# Patient Record
Sex: Male | Born: 1945 | Race: White | Hispanic: No | Smoking: Former smoker
Health system: Southern US, Community
[De-identification: ages and names within clinical notes are randomized; demographics above are authoritative.]

## PROBLEM LIST (undated history)

## (undated) DIAGNOSIS — I10 Essential (primary) hypertension: Secondary | ICD-10-CM

## (undated) DIAGNOSIS — M1990 Unspecified osteoarthritis, unspecified site: Secondary | ICD-10-CM

## (undated) HISTORY — PX: JOINT REPLACEMENT: SHX530

## (undated) HISTORY — PX: KNEE ARTHROSCOPY: SUR90

## (undated) HISTORY — PX: NASAL SINUS SURGERY: SHX719

## (undated) HISTORY — PX: BACK SURGERY: SHX140

---

## 2000-05-09 ENCOUNTER — Emergency Department (HOSPITAL_COMMUNITY): Admission: EM | Admit: 2000-05-09 | Discharge: 2000-05-09 | Payer: Self-pay

## 2000-05-29 ENCOUNTER — Emergency Department (HOSPITAL_COMMUNITY): Admission: EM | Admit: 2000-05-29 | Discharge: 2000-05-29 | Payer: Self-pay | Admitting: Emergency Medicine

## 2010-04-04 ENCOUNTER — Emergency Department (HOSPITAL_COMMUNITY): Admission: EM | Admit: 2010-04-04 | Discharge: 2010-04-04 | Payer: Self-pay | Admitting: Emergency Medicine

## 2010-05-16 ENCOUNTER — Emergency Department (HOSPITAL_COMMUNITY): Admission: EM | Admit: 2010-05-16 | Discharge: 2010-05-16 | Payer: Self-pay | Admitting: Emergency Medicine

## 2010-05-25 ENCOUNTER — Encounter (INDEPENDENT_AMBULATORY_CARE_PROVIDER_SITE_OTHER): Payer: Self-pay | Admitting: *Deleted

## 2010-11-04 NOTE — Letter (Signed)
Summary: Primary Care Appointment Letter  [facility]  [address] [place], [place] [address]   Phone: [phone]  Fax: [phone]    05/25/2010 MRN: [id number]  [facility] [address] [place], [place]  [address]  Dear Mr. Babiarz,   Your Primary Care Physician  has indicated that:    _______it is time to schedule an appointment.    _______you missed your appointment on______ and need to call and          reschedule.    _______you need to have lab work done.    _______you need to schedule an appointment discuss lab or test results.    ___X____you need to call to reschedule your appointment that is                       scheduled on Sept 23rd.     Please call our office as soon as possible. Our phone number is [phone] option #1. Please press option 1. Our office is open 8a-12noon and 1p-5p, Monday through Friday.     Thank you,    [facility]

## 2011-04-20 ENCOUNTER — Other Ambulatory Visit: Payer: Self-pay | Admitting: Orthopedic Surgery

## 2011-04-20 ENCOUNTER — Ambulatory Visit (HOSPITAL_COMMUNITY)
Admission: RE | Admit: 2011-04-20 | Discharge: 2011-04-20 | Disposition: A | Discharge status: Home / Self Care | Payer: Medicare Other | Source: Ambulatory Visit | Attending: Orthopedic Surgery | Admitting: Orthopedic Surgery

## 2011-04-20 ENCOUNTER — Encounter (HOSPITAL_COMMUNITY): Payer: Medicare Other

## 2011-04-20 ENCOUNTER — Other Ambulatory Visit (HOSPITAL_COMMUNITY): Payer: Self-pay | Admitting: Orthopedic Surgery

## 2011-04-20 DIAGNOSIS — Z01812 Encounter for preprocedural laboratory examination: Secondary | ICD-10-CM | POA: Insufficient documentation

## 2011-04-20 DIAGNOSIS — M7130 Other bursal cyst, unspecified site: Secondary | ICD-10-CM | POA: Insufficient documentation

## 2011-04-20 DIAGNOSIS — M47817 Spondylosis without myelopathy or radiculopathy, lumbosacral region: Secondary | ICD-10-CM | POA: Insufficient documentation

## 2011-04-20 DIAGNOSIS — Z01818 Encounter for other preprocedural examination: Secondary | ICD-10-CM

## 2011-04-20 DIAGNOSIS — Z01811 Encounter for preprocedural respiratory examination: Secondary | ICD-10-CM | POA: Insufficient documentation

## 2011-04-20 DIAGNOSIS — J984 Other disorders of lung: Secondary | ICD-10-CM | POA: Insufficient documentation

## 2011-04-20 LAB — URINALYSIS, ROUTINE W REFLEX MICROSCOPIC
Ketones, ur: NEGATIVE mg/dL
Nitrite: NEGATIVE
Protein, ur: NEGATIVE mg/dL

## 2011-04-20 LAB — COMPREHENSIVE METABOLIC PANEL
AST: 11 U/L (ref 0–37)
Alkaline Phosphatase: 70 U/L (ref 39–117)
BUN: 13 mg/dL (ref 6–23)
CO2: 26 mEq/L (ref 19–32)
Calcium: 9.7 mg/dL (ref 8.4–10.5)
Chloride: 103 mEq/L (ref 96–112)
Creatinine, Ser: 0.71 mg/dL (ref 0.50–1.35)
Glucose, Bld: 92 mg/dL (ref 70–99)
Sodium: 137 mEq/L (ref 135–145)
Total Bilirubin: 0.4 mg/dL (ref 0.3–1.2)
Total Protein: 7.3 g/dL (ref 6.0–8.3)

## 2011-04-20 LAB — DIFFERENTIAL
Basophils Absolute: 0 10*3/uL (ref 0.0–0.1)
Eosinophils Relative: 2 % (ref 0–5)

## 2011-04-20 LAB — CBC
HCT: 46 % (ref 39.0–52.0)
Hemoglobin: 15 g/dL (ref 13.0–17.0)
MCH: 30.5 pg (ref 26.0–34.0)
MCHC: 32.6 g/dL (ref 30.0–36.0)
MCV: 93.7 fL (ref 78.0–100.0)
Platelets: 248 10*3/uL (ref 150–400)
RBC: 4.91 MIL/uL (ref 4.22–5.81)

## 2011-04-20 LAB — SURGICAL PCR SCREEN: Staphylococcus aureus: NEGATIVE

## 2011-04-20 LAB — PROTIME-INR
INR: 1.06 (ref 0.00–1.49)
Prothrombin Time: 14 seconds (ref 11.6–15.2)

## 2011-04-27 ENCOUNTER — Inpatient Hospital Stay (HOSPITAL_COMMUNITY): Payer: Medicare Other

## 2011-04-27 ENCOUNTER — Inpatient Hospital Stay (HOSPITAL_COMMUNITY)
Admission: RE | Admit: 2011-04-27 | Discharge: 2011-04-30 | DRG: 491 | Disposition: A | Discharge status: Home Health | Payer: Medicare Other | Source: Ambulatory Visit | Attending: Orthopedic Surgery | Admitting: Orthopedic Surgery

## 2011-04-27 DIAGNOSIS — Z87891 Personal history of nicotine dependence: Secondary | ICD-10-CM

## 2011-04-27 DIAGNOSIS — Z791 Long term (current) use of non-steroidal anti-inflammatories (NSAID): Secondary | ICD-10-CM

## 2011-04-27 DIAGNOSIS — I10 Essential (primary) hypertension: Secondary | ICD-10-CM | POA: Diagnosis present

## 2011-04-27 DIAGNOSIS — M7130 Other bursal cyst, unspecified site: Secondary | ICD-10-CM | POA: Diagnosis present

## 2011-04-27 DIAGNOSIS — M48061 Spinal stenosis, lumbar region without neurogenic claudication: Principal | ICD-10-CM | POA: Diagnosis present

## 2011-04-27 DIAGNOSIS — R509 Fever, unspecified: Secondary | ICD-10-CM | POA: Diagnosis not present

## 2011-04-27 DIAGNOSIS — Z01812 Encounter for preprocedural laboratory examination: Secondary | ICD-10-CM

## 2011-04-27 DIAGNOSIS — M216X9 Other acquired deformities of unspecified foot: Secondary | ICD-10-CM | POA: Diagnosis present

## 2011-04-27 DIAGNOSIS — Z79899 Other long term (current) drug therapy: Secondary | ICD-10-CM

## 2011-04-29 NOTE — Discharge Summary (Signed)
  NAME:  Anthony Wiley, Anthony Wiley NO.:  [id number]  MEDICAL RECORD NO.:  [id number]  LOCATION:  1605                         FACILITY:  [facility]  PHYSICIAN:  Georges Lynch. Nichole Keltner, M.D.DATE OF BIRTH:  April 22, 1946  DATE OF ADMISSION:  04/27/2011 DATE OF DISCHARGE:                              DISCHARGE SUMMARY   He was admitted to the hospital on April 27, 2011, and was taken to surgery by me which I did a decompressive lumbar laminectomy at L4-5 and excision of a large synovial cyst and exploration of the L4 and L5 regions of the right.  Note, he had a large synovial cyst compressing his L5 nerve root.  He also had a partial footdrop preop.  Postop on day #1, April 28, 2011, his footdrop was really markedly improved.  He had basically no leg pain but he had rather significant pain in his back which I can understand.  Temperature was 99.8.  He was moving his legs well.  He had voided postop.  We will have him up ambulating today and hopefully by the morning he will be discharged, to be followed in the office.  PERTINENT LABORATORY STUDIES:  His nasal screening test was no MRSA, no staph.  His white count on admission 9.5, hemoglobin 15, hematocrit 46. His platelets are 248.  Normal differential.  The sodium was 137, potassium 4.1, chloride 103, glucose 92, BUN 13, creatinine 0.71.  His SGOT was 11, SGPT was 8.  His INR was 1.06.  The PTT was 29.  The urinalysis was basically negative.  The chest x-ray showed no active disease.  DISCHARGE DIAGNOSES: 1. Spinal stenosis L4-5. 2. 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  DISCHARGE CONDITION:  Improved.  DISCHARGE DIET:  Regular diet.  DISCHARGE MEDICATIONS:  All listed in the discharge manager.  DISCHARGE INSTRUCTIONS:  He will see me in the office in 2 weeks or prior if presenting problem and I will have home health check him for dressing and wound checks and therapy at home.           ______________________________ Georges Lynch. Darrelyn Hillock, M.D.     RAG/MEDQ  D:  04/28/2011  T:  04/28/2011  Job:  [id number]  cc:   Windy Fast A. Darrelyn Hillock, M.D. Fax: [phone]  Electronically Signed by Ranee Gosselin M.D. on 04/29/2011 03:26:10 PM

## 2011-04-29 NOTE — Op Note (Signed)
NAME:  OLE, LAFON NO.:  [id number]  MEDICAL RECORD NO.:  [id number]  LOCATION:                                 FACILITY:  PHYSICIAN:  Georges Lynch. Davit Vassar, M.D.DATE OF BIRTH:  1946-03-16  DATE OF PROCEDURE: DATE OF DISCHARGE:                              OPERATIVE REPORT   INDICATIONS:  Mr. Anthony Wiley is brought in to the hospital and taken to surgery today on April 27, 2011, with the chief complaint of pain in his right lower extremity and a partial footdrop on the right.  His MRI revealed a severe recess stenosis and a synovial cyst versus arachnoid cyst.  PREOPERATIVE DIAGNOSES: 1. Spinal stenosis at L4-5. 2. Probable large synovial cyst at L4-5 from the L4-5 facet on the     right.  POSTOPERATIVE DIAGNOSES: 1. Spinal stenosis at L4-5. 2. Probable large synovial cyst at L4-5 from the L4-5 facet on the     right.  SURGEON:  Georges Lynch. Davionna Blacksher, MD  ASSISTANT:  Jene Every, MD  OPERATIONS: 1. "Central complete decompressive lumbar laminectomy at L4-5 for     spinal stenosis. 2. Exploration of the L4 root and the L5 root on the right, 2 roots     were explored. 3. Excision of a large synovial cyst at L4-5 on the right."  PROCEDURE:  Appropriate time-out was carried out first while the patient was on the spinal frame and sterile prep and draping was carried out. He had 2 g of IV Ancef.  At this time, prior to taking him in to the surgery in the OR, I did mark the right side of his back although we were going central in the holding area.  Under general anesthesia, routine orthopedic prep and draping of the lower back was carried out.  With the patient on spinal frame, as I mentioned, he had 2 g of IV Ancef.  Appropriate time-out, as I mentioned, was carried out first.  Following that, 2 needles were placed in the back for localization purposes.  X-ray was taken.  Following that, an incision was made over the L4-5 space.  Bleeders were identified  and cauterized.  The muscle separated from the lamina and spinous processes bilaterally.  The venous bleeders were cauterized.  I then went down and put 2 Kocher clamps on the spinous processes and identified the L4-5 region.  I then removed the spinous process of L4, part of L5 and did a decompressive lumbar laminectomy.  The microscope was brought in and we did use the "microscope."  At this point, we identified the ligamentum flavum, protecting the underlying dura and removed the ligamentum flavum.  I went far out laterally mainly on the right to decompress the lateral recess and we had to trace that cyst way out laterally and caudalward to first make sure it was a synovial cyst and not an arachnoid cyst.  We identified and decompressed the root above the L4 root and the root distal to the L5 root.  Following that, we then identified the large synovial cyst.  I placed a needle in the cyst first to make sure it was not an arachnoid cyst, it was not.  He  had a classical fluid necrotic appearance of the ganglion cyst that was then removed.  We did a nice foraminotomy for the roots above and below, 2 roots on the right.  Thoroughly irrigated out the area.  Good hemostasis was maintained.  I loosely applied some thrombin-soaked Gelfoam, closed the wound layers in usual fashion except I left a small deep distal part of the wound open for drainage purposes.  The subcu was closed with 0 Vicryl, skin with metal staples.  Sterile Neosporin dressing was applied.          ______________________________ Georges Lynch Darrelyn Hillock, M.D.     RAG/MEDQ  D:  04/27/2011  T:  04/27/2011  Job:  [id number]  cc:   Jamey Reas, MD Fax: [phone]  Georges Lynch. Darrelyn Hillock, M.D. Fax: [phone]  Electronically Signed by Ranee Gosselin M.D. on 04/29/2011 03:26:11 PM

## 2011-04-29 NOTE — Discharge Summary (Signed)
  NAME:  Anthony Wiley, Anthony Wiley NO.:  [id number]  MEDICAL RECORD NO.:  [id number]  LOCATION:  1605                         FACILITY:  [facility]  PHYSICIAN:  Georges Lynch. Kynslei Art, M.D.DATE OF BIRTH:  Jun 26, 1946  DATE OF ADMISSION:  04/27/2011 DATE OF DISCHARGE:                              DISCHARGE SUMMARY   ADDENDUM:  ANTICIPATED DATE OF DISCHARGE:  April 30, 2011.  I had to hold him actually because he is complaining of severe pain in the back.  He not been able to ambulate, so I will try to get him up today again to ambulate and hopefully tomorrow will let him go home.  He lives alone at home and he says he cannot manage himself like this and he preferred not to go to skilled nursing.  I did change his dressing today on April 29, 2011.  His wound looked excellent.  He had good motor function and his lower circulation is intact.  Calves are fine.  No phlebitis.  He had been afebrile now today, yesterday he was running a temperature.  So I think the safest thing to do would be to keep him overnight and let him go home Saturday.  DISCHARGE CONDITION:  Improved.          ______________________________ Georges Lynch Darrelyn Hillock, M.D.     RAG/MEDQ  D:  04/29/2011  T:  04/29/2011  Job:  [id number]  Electronically Signed by Ranee Gosselin M.D. on 04/29/2011 03:26:14 PM

## 2011-04-29 NOTE — H&P (Signed)
  NAME:  Anthony Wiley NO.:  [id number]  MEDICAL RECORD NO.:  [id number]  LOCATION:                                 FACILITY:  PHYSICIAN:  Georges Lynch. Terrez Ander, M.D.DATE OF BIRTH:  Mar 08, 1946  DATE OF ADMISSION: DATE OF DISCHARGE:                             HISTORY & PHYSICAL   He is scheduled for surgery here on Wednesday.  Anthony Wiley was seen by me in the office early July, had an MRI of his back that showed he had spinal stenosis at L4-5 with a large synovial cyst at L4-5 on the right.  He presented with weakness of his dorsiflexors and a partial footdrop on the right.  He has had no trauma. His current medications, hydrocodone and ibuprofen, amlodipine.  ALLERGIES:  ACE INHIBITOR.  REVIEW OF SYSTEMS:  HEAD, EYES, EARS, NOSE, AND THROAT: Noncontributory.  CARDIOVASCULAR:  No chest pain or heart disease or history of hypertension.  RESPIRATORY:  No shortness of breath.  GI:  No ulcers.  GENITOURINARY:  Negative for dysuria.  MUSCULOSKELETAL:  As mentioned above.  SKIN:  No rashes or skin cancers.  NEUROLOGIC:  He has weakness of his right lower extremity in regard to his dorsiflexors of the right foot.  HEMATOLOGIC:  No bleeding disorders.  PHYSICAL EXAMINATION:  VITAL SIGNS:  Temperature is afebrile, pulse 60, respirations 18 and regular, blood pressure 120/80. HEAD, EYES, EARS, NOSE, AND THROAT:  Normal. NECK:  Normal. CHEST:  Lungs were clear. HEART:  Normal sinus rhythm with no murmur. ABDOMEN:  Negative. GU:  He had no GU issues. EXTREMITIES:  He had weakness of the dorsiflexors of his right foot and a positive straight leg raising on the right. NEUROLOGIC:  He had weakness of the dorsiflexors of his right foot and a positive straight leg raising on the right.  X-ray and MRI of his back showed a large synovial cyst at L4-5 on the right.  IMPRESSION: 1. Spinal stenosis, L4-5. 2. Large synovial cyst L4-5, on the right.  PLAN:  I am going to  do a decompressive lumbar laminectomy at L4-5 and excise the large synovial cyst at L4-5 on the right.          ______________________________ Georges Lynch. Darrelyn Hillock, M.D.     RAG/MEDQ  D:  04/25/2011  T:  04/26/2011  Job:  [id number]  Electronically Signed by Ranee Gosselin M.D. on 04/29/2011 03:26:07 PM

## 2011-10-11 ENCOUNTER — Other Ambulatory Visit (HOSPITAL_COMMUNITY): Payer: Self-pay | Admitting: Neurological Surgery

## 2011-10-11 ENCOUNTER — Other Ambulatory Visit: Payer: Self-pay | Admitting: Neurological Surgery

## 2011-10-11 DIAGNOSIS — M5416 Radiculopathy, lumbar region: Secondary | ICD-10-CM

## 2011-10-26 ENCOUNTER — Other Ambulatory Visit (HOSPITAL_COMMUNITY): Payer: Self-pay | Admitting: Neurological Surgery

## 2011-10-26 ENCOUNTER — Other Ambulatory Visit: Payer: Self-pay | Admitting: Neurological Surgery

## 2011-10-26 DIAGNOSIS — M5416 Radiculopathy, lumbar region: Secondary | ICD-10-CM

## 2011-11-03 ENCOUNTER — Other Ambulatory Visit (HOSPITAL_COMMUNITY): Payer: Medicare Other

## 2011-11-03 ENCOUNTER — Ambulatory Visit (HOSPITAL_COMMUNITY)
Admission: RE | Admit: 2011-11-03 | Discharge: 2011-11-03 | Disposition: A | Discharge status: Home / Self Care | Payer: Medicare Other | Source: Ambulatory Visit | Attending: Neurological Surgery | Admitting: Neurological Surgery

## 2011-11-03 VITALS — BP 120/70 | HR 81 | Temp 96.9°F | Resp 16 | Ht 66.0 in | Wt 190.0 lb

## 2011-11-03 DIAGNOSIS — M545 Low back pain, unspecified: Secondary | ICD-10-CM | POA: Insufficient documentation

## 2011-11-03 DIAGNOSIS — M519 Unspecified thoracic, thoracolumbar and lumbosacral intervertebral disc disorder: Secondary | ICD-10-CM | POA: Insufficient documentation

## 2011-11-03 DIAGNOSIS — M5416 Radiculopathy, lumbar region: Secondary | ICD-10-CM

## 2011-11-03 DIAGNOSIS — M5126 Other intervertebral disc displacement, lumbar region: Secondary | ICD-10-CM | POA: Insufficient documentation

## 2011-11-03 DIAGNOSIS — M546 Pain in thoracic spine: Secondary | ICD-10-CM | POA: Insufficient documentation

## 2011-11-03 DIAGNOSIS — M5137 Other intervertebral disc degeneration, lumbosacral region: Secondary | ICD-10-CM | POA: Insufficient documentation

## 2011-11-03 DIAGNOSIS — R209 Unspecified disturbances of skin sensation: Secondary | ICD-10-CM | POA: Insufficient documentation

## 2011-11-03 DIAGNOSIS — M51379 Other intervertebral disc degeneration, lumbosacral region without mention of lumbar back pain or lower extremity pain: Secondary | ICD-10-CM | POA: Insufficient documentation

## 2011-11-03 MED ORDER — HYDROCODONE-ACETAMINOPHEN 5-325 MG PO TABS
1.0000 | ORAL_TABLET | ORAL | Status: DC | PRN
  Administered 2011-11-03: 2 via ORAL

## 2011-11-03 MED ORDER — DIAZEPAM 5 MG PO TABS
10.0000 mg | ORAL_TABLET | Freq: Once | ORAL | Status: AC
  Administered 2011-11-03: 10 mg via ORAL

## 2011-11-03 MED ORDER — ONDANSETRON HCL 4 MG/2ML IJ SOLN: 4.0000 mg | Freq: Four times a day (QID) | INTRAMUSCULAR | Status: DC | PRN

## 2011-11-03 MED ORDER — DIAZEPAM 5 MG PO TABS
ORAL_TABLET | ORAL | Status: AC
  Administered 2011-11-03: 10 mg via ORAL
  Filled 2011-11-03: qty 2

## 2011-11-03 MED ORDER — HYDROCODONE-ACETAMINOPHEN 5-325 MG PO TABS
ORAL_TABLET | ORAL | Status: AC
  Filled 2011-11-03: qty 2

## 2011-11-03 NOTE — Procedures (Signed)
Pre op Dx: Lumbar spondylosis Post op Dx: Lumbar spondylosis Procedure: MR myelogram Surgeon: Beatriz Quintela Puncture level: L4-5 Fluid color: Clear and colorless Injection: 14 cc iohexol 180 Findings: Lumbar spondylosis and stenosis Anthony Anthony Wiley  #[id number] DOB:  09-12-1946  September 23, 2011  CHIEF COMPLAINT:   Back pain.  HISTORY OF PRESENT ILLNESS:  Mr. Anthony Anthony Wiley is a 66 year old left-handed individual who tells me that he had surgery back in July of this year.  For some time before that, he was having problems with pain in his right lower extremity and he was worked up by Dr. Worthy Rancher who did an MRI of his lumbar spine that demonstrated the presence of a synovial cyst on the right side at the L4-5 level.  He underwent surgical decompression of the cyst and he tells me that since the surgery he has been having a terrible time with centralized back pain.  He feels that the leg pain has gotten somewhat better after the surgery but the back pain overwhelms any problems that he had with the leg.  He was told to give it time.  Back in October he had some plain x-rays taken and he is seen now for another opinion as he feels he is not getting anywhere in Dr. Jeannetta Ellis office.    PAST MEDICAL HISTORY:  His past medical history reveals that his general health is fair.  He has some high blood pressure.   ALLERGIES:     No known drug allergies.  SOCIAL HISTORY:    He does not smoke, he does not use alcohol.  Height and weight have been stable at 190 pounds and 5'6" in height.    REVIEW OF SYSTEMS:   Notable for some nasal congestion and drainage and sinus problems, sore throat and high blood pressure, and pain in the left shoulder and arm that he notes has been present since the time of his surgery.    CURRENT MEDICATIONS:  His current medications include Amlodipine 10 mg. q.d. for blood pressure and Oxycodone that he takes about twice a day for the back pain which has been prescribed continuously by Dr.  Darrelyn Hillock since the time of surgery.    PHYSICAL EXAMINATION:  His physical examination reveals that he stands with a 10 degrees forward stoop.  Straightening into the erect position aggravates the centralized back pain.  Flexing forward does give him some relief.  His flexion forward is limited to 60 degrees.  He cannot hyperextend beyond the neutral.  His motor strength appears to demonstrate some  Anthony Anthony Wiley  #[id number] DOB:  December 12, 1945   September 23, 2011 Page 2  mild weakness in the tibialis anterior group on the right being graded at 4/5.  The gastrocs are 5/5.  Reflexes are trace in the patellae, absent in both Achilles and Babinski is downgoing.    IMPRESSION AND PLAN:   The patient has evidence of spondylitic degeneration at L4-5 noted on MRI that was done preoperatively in June of this year.  He does have a synovia cyst on the right side at L4-5. In October, he had an AP and lateral x-ray of the lumbar spine which showed that he had degenerative changes but no evidence of a listhesis and did not determine whether he had any evidence for facet fracture.  My suspicion is that the patient likely has a facet fracture at the L4-5 level.  The cyst itself is fairly laterally located and I suspect he had a wide lateral laminotomy in  order to access the cyst and resect it.  My concern is that he may have had a fracture through the pars and now is having residual pain as a result of that fracture.  In order to demonstrate this, we discussed obtaining a new MRI but I believe that the better test would be a myelogram and post myelogram CT scan to better identify any pathoanatomy at the L4-5 level.  Though the patient notes he has had some relief of the leg pain that he had preoperatively, he is so overwhelmed by the degree of back pain that he is having he is hardly functional in his activities of daily living.  He would like to consider getting something further done to treat this directly.    [facility]         Stefani Dama, M.D.

## 2011-11-03 NOTE — Progress Notes (Signed)
Discharge instructions given to pt and family member

## 2011-11-07 ENCOUNTER — Telehealth (HOSPITAL_COMMUNITY): Payer: Self-pay

## 2013-07-29 ENCOUNTER — Encounter (HOSPITAL_COMMUNITY): Payer: Self-pay | Admitting: Emergency Medicine

## 2013-07-29 ENCOUNTER — Emergency Department (HOSPITAL_COMMUNITY)
Admission: EM | Admit: 2013-07-29 | Discharge: 2013-07-29 | Disposition: A | Discharge status: Home / Self Care | Payer: Medicare Other | Attending: Emergency Medicine | Admitting: Emergency Medicine

## 2013-07-29 DIAGNOSIS — R21 Rash and other nonspecific skin eruption: Secondary | ICD-10-CM | POA: Insufficient documentation

## 2013-07-29 DIAGNOSIS — Z9889 Other specified postprocedural states: Secondary | ICD-10-CM | POA: Insufficient documentation

## 2013-07-29 DIAGNOSIS — IMO0002 Reserved for concepts with insufficient information to code with codable children: Secondary | ICD-10-CM | POA: Insufficient documentation

## 2013-07-29 DIAGNOSIS — Z79899 Other long term (current) drug therapy: Secondary | ICD-10-CM | POA: Insufficient documentation

## 2013-07-29 DIAGNOSIS — I10 Essential (primary) hypertension: Secondary | ICD-10-CM | POA: Insufficient documentation

## 2013-07-29 DIAGNOSIS — Z87891 Personal history of nicotine dependence: Secondary | ICD-10-CM | POA: Insufficient documentation

## 2013-07-29 HISTORY — DX: Essential (primary) hypertension: I10

## 2013-07-29 MED ORDER — PREDNISONE 20 MG PO TABS
60.0000 mg | ORAL_TABLET | Freq: Once | ORAL | Status: AC
  Administered 2013-07-29: 60 mg via ORAL
  Filled 2013-07-29: qty 3

## 2013-07-29 MED ORDER — PREDNISONE 20 MG PO TABS: 40.0000 mg | ORAL_TABLET | Freq: Every day | ORAL | Status: DC

## 2013-07-29 MED ORDER — HYDROXYZINE HCL 25 MG PO TABS: 25.0000 mg | ORAL_TABLET | Freq: Four times a day (QID) | ORAL | Status: DC | PRN

## 2013-07-29 NOTE — ED Provider Notes (Signed)
CSN: [id number]     Arrival date & time 07/29/13  1140 History   First MD Initiated Contact with Patient 07/29/13 1150     No chief complaint on file.  (Consider location/radiation/quality/duration/timing/severity/associated sxs/prior Treatment) HPI  67 year old male with rash. Onset about a month ago. Rash is in his mid/upper back. It itches and burns. Patient gets some temporary relief when placing rubbing alcohol on it. Once this evaporates though the stinging returns. Patient has not seen a health care provider for this rash yet. He was encouraged to seek evaluation from staff at the pharmacy over concern that this may possibly be shingles. No fevers or chills. No new exposures that he is aware of. No nausea or vomiting. No history similar symptoms. No contacts with similar.  Past Medical History  Diagnosis Date  . Hypertension    Past Surgical History  Procedure Laterality Date  . Back surgery     History reviewed. No pertinent family history. History  Substance Use Topics  . Smoking status: Former Smoker    Quit date: 10/03/1997  . Smokeless tobacco: Not on file  . Alcohol Use: Yes     Comment: rare    Review of Systems  All systems reviewed and negative, other than as noted in HPI.   Allergies  Review of patient's allergies indicates no known allergies.  Home Medications   Current Outpatient Rx  Name  Route  Sig  Dispense  Refill  . amLODipine (NORVASC) 10 MG tablet   Oral   Take 10 mg by mouth daily.         . fluticasone (FLONASE) 50 MCG/ACT nasal spray   Nasal   Place 1 spray into the nose daily.         Marland Kitchen HYDROcodone-acetaminophen (NORCO) 5-325 MG per tablet   Oral   Take 1 tablet by mouth every 6 (six) hours as needed. For pain         . ibuprofen (ADVIL,MOTRIN) 100 MG tablet   Oral   Take 100 mg by mouth every 6 (six) hours as needed. For pain         . polyvinyl alcohol (LIQUIFILM TEARS) 1.4 % ophthalmic solution   Both Eyes   Place 1-2  drops into both eyes 4 (four) times daily.          BP 129/83  Pulse 105  Temp(Src) 98.1 F (36.7 C) (Oral)  Resp 20  SpO2 98% Physical Exam  Nursing note and vitals reviewed. Constitutional: He appears well-developed and well-nourished. No distress.  HENT:  Head: Normocephalic and atraumatic.  Eyes: Conjunctivae are normal. Right eye exhibits no discharge. Left eye exhibits no discharge.  Neck: Neck supple.  Cardiovascular: Normal rate, regular rhythm and normal heart sounds.  Exam reveals no gallop and no friction rub.   No murmur heard. Pulmonary/Chest: Effort normal and breath sounds normal. No respiratory distress.  Abdominal: Soft. He exhibits no distension. There is no tenderness.  Musculoskeletal: He exhibits no edema and no tenderness.  Neurological: He is alert.  Skin: Skin is warm and dry.     Patchy, nonspecific erythematous rash to midthoracic region of the back. Extends laterally on either side, not quite to area of posterior axillary line. Lesions slightly raised. No vesicles. Dry.   Psychiatric: He has a normal mood and affect. His behavior is normal. Thought content normal.    ED Course  Procedures (including critical care time) Labs Review Labs Reviewed - No data to display Imaging Review  No results found.  EKG Interpretation   None       MDM   1. Rash of back     67 year old male with a nonspecific paretic rash to his back. This is bilateral. Distribution general morphology is not consistent with shingles. No obvious trigger. Generally appears well. Instructed to stop applying alcohol to this area as this is likely further exacerbating. Will provide a short course of steroids and hydroxyzine for itching. Patient needs to followup with his primary care provider later this week for further evaluation and return to the emergency room for symptoms as discussed arranging else concerning to him.   Raeford Razor, MD 07/29/13 1208

## 2013-07-29 NOTE — ED Notes (Signed)
Pt reports rash on his back for a month that is getting progressively worse; pt sts he went to different pharmacies to get something otc and was told to go to ed d/t possibility of rash being shingles. Pt sts rash is painful, burning and itching.

## 2015-07-22 DIAGNOSIS — Z23 Encounter for immunization: Secondary | ICD-10-CM | POA: Diagnosis not present

## 2016-04-22 DIAGNOSIS — M545 Low back pain: Secondary | ICD-10-CM | POA: Diagnosis not present

## 2016-04-22 DIAGNOSIS — I10 Essential (primary) hypertension: Secondary | ICD-10-CM | POA: Diagnosis not present

## 2016-05-23 DIAGNOSIS — M1712 Unilateral primary osteoarthritis, left knee: Secondary | ICD-10-CM | POA: Diagnosis not present

## 2016-05-23 DIAGNOSIS — M5442 Lumbago with sciatica, left side: Secondary | ICD-10-CM | POA: Diagnosis not present

## 2016-05-23 DIAGNOSIS — M1711 Unilateral primary osteoarthritis, right knee: Secondary | ICD-10-CM | POA: Diagnosis not present

## 2016-06-21 DIAGNOSIS — Z23 Encounter for immunization: Secondary | ICD-10-CM | POA: Diagnosis not present

## 2016-07-14 DIAGNOSIS — M25562 Pain in left knee: Secondary | ICD-10-CM | POA: Diagnosis not present

## 2016-07-14 DIAGNOSIS — R262 Difficulty in walking, not elsewhere classified: Secondary | ICD-10-CM | POA: Diagnosis not present

## 2016-07-14 DIAGNOSIS — M170 Bilateral primary osteoarthritis of knee: Secondary | ICD-10-CM | POA: Diagnosis not present

## 2016-07-14 DIAGNOSIS — M25561 Pain in right knee: Secondary | ICD-10-CM | POA: Diagnosis not present

## 2016-07-19 DIAGNOSIS — M1711 Unilateral primary osteoarthritis, right knee: Secondary | ICD-10-CM | POA: Diagnosis not present

## 2016-07-19 DIAGNOSIS — M25561 Pain in right knee: Secondary | ICD-10-CM | POA: Diagnosis not present

## 2016-07-26 DIAGNOSIS — M1712 Unilateral primary osteoarthritis, left knee: Secondary | ICD-10-CM | POA: Diagnosis not present

## 2016-07-26 DIAGNOSIS — M25562 Pain in left knee: Secondary | ICD-10-CM | POA: Diagnosis not present

## 2016-08-04 DIAGNOSIS — M1711 Unilateral primary osteoarthritis, right knee: Secondary | ICD-10-CM | POA: Diagnosis not present

## 2016-08-04 DIAGNOSIS — M25561 Pain in right knee: Secondary | ICD-10-CM | POA: Diagnosis not present

## 2016-08-05 DIAGNOSIS — Z23 Encounter for immunization: Secondary | ICD-10-CM | POA: Diagnosis not present

## 2016-08-15 DIAGNOSIS — M1711 Unilateral primary osteoarthritis, right knee: Secondary | ICD-10-CM | POA: Diagnosis not present

## 2016-08-15 DIAGNOSIS — M25561 Pain in right knee: Secondary | ICD-10-CM | POA: Diagnosis not present

## 2016-08-17 DIAGNOSIS — M1712 Unilateral primary osteoarthritis, left knee: Secondary | ICD-10-CM | POA: Diagnosis not present

## 2016-08-17 DIAGNOSIS — M25562 Pain in left knee: Secondary | ICD-10-CM | POA: Diagnosis not present

## 2016-08-22 DIAGNOSIS — M25561 Pain in right knee: Secondary | ICD-10-CM | POA: Diagnosis not present

## 2016-08-22 DIAGNOSIS — M170 Bilateral primary osteoarthritis of knee: Secondary | ICD-10-CM | POA: Diagnosis not present

## 2016-08-22 DIAGNOSIS — M25562 Pain in left knee: Secondary | ICD-10-CM | POA: Diagnosis not present

## 2016-09-06 DIAGNOSIS — M170 Bilateral primary osteoarthritis of knee: Secondary | ICD-10-CM | POA: Diagnosis not present

## 2016-09-15 DIAGNOSIS — M1711 Unilateral primary osteoarthritis, right knee: Secondary | ICD-10-CM | POA: Diagnosis not present

## 2016-09-20 DIAGNOSIS — M25561 Pain in right knee: Secondary | ICD-10-CM | POA: Diagnosis not present

## 2016-09-20 DIAGNOSIS — M238X1 Other internal derangements of right knee: Secondary | ICD-10-CM | POA: Diagnosis not present

## 2016-10-05 DIAGNOSIS — M23221 Derangement of posterior horn of medial meniscus due to old tear or injury, right knee: Secondary | ICD-10-CM | POA: Diagnosis not present

## 2016-10-05 DIAGNOSIS — G8918 Other acute postprocedural pain: Secondary | ICD-10-CM | POA: Diagnosis not present

## 2016-10-05 DIAGNOSIS — M659 Synovitis and tenosynovitis, unspecified: Secondary | ICD-10-CM | POA: Diagnosis not present

## 2016-10-05 DIAGNOSIS — M94261 Chondromalacia, right knee: Secondary | ICD-10-CM | POA: Diagnosis not present

## 2016-10-05 DIAGNOSIS — M23241 Derangement of anterior horn of lateral meniscus due to old tear or injury, right knee: Secondary | ICD-10-CM | POA: Diagnosis not present

## 2016-10-05 DIAGNOSIS — M23341 Other meniscus derangements, anterior horn of lateral meniscus, right knee: Secondary | ICD-10-CM | POA: Diagnosis not present

## 2016-10-05 DIAGNOSIS — M23321 Other meniscus derangements, posterior horn of medial meniscus, right knee: Secondary | ICD-10-CM | POA: Diagnosis not present

## 2016-10-11 DIAGNOSIS — M238X1 Other internal derangements of right knee: Secondary | ICD-10-CM | POA: Diagnosis not present

## 2016-10-11 DIAGNOSIS — M25561 Pain in right knee: Secondary | ICD-10-CM | POA: Diagnosis not present

## 2016-10-11 DIAGNOSIS — Z4789 Encounter for other orthopedic aftercare: Secondary | ICD-10-CM | POA: Diagnosis not present

## 2016-10-24 DIAGNOSIS — Z4789 Encounter for other orthopedic aftercare: Secondary | ICD-10-CM | POA: Diagnosis not present

## 2016-10-24 DIAGNOSIS — M1711 Unilateral primary osteoarthritis, right knee: Secondary | ICD-10-CM | POA: Diagnosis not present

## 2016-10-31 DIAGNOSIS — Z4789 Encounter for other orthopedic aftercare: Secondary | ICD-10-CM | POA: Diagnosis not present

## 2016-10-31 DIAGNOSIS — M238X1 Other internal derangements of right knee: Secondary | ICD-10-CM | POA: Diagnosis not present

## 2016-10-31 DIAGNOSIS — M1711 Unilateral primary osteoarthritis, right knee: Secondary | ICD-10-CM | POA: Diagnosis not present

## 2016-11-28 DIAGNOSIS — M1711 Unilateral primary osteoarthritis, right knee: Secondary | ICD-10-CM | POA: Diagnosis not present

## 2016-11-28 DIAGNOSIS — Z4789 Encounter for other orthopedic aftercare: Secondary | ICD-10-CM | POA: Diagnosis not present

## 2016-11-28 DIAGNOSIS — M238X1 Other internal derangements of right knee: Secondary | ICD-10-CM | POA: Diagnosis not present

## 2016-12-22 DIAGNOSIS — M25471 Effusion, right ankle: Secondary | ICD-10-CM | POA: Diagnosis not present

## 2016-12-22 DIAGNOSIS — M25571 Pain in right ankle and joints of right foot: Secondary | ICD-10-CM | POA: Diagnosis not present

## 2017-01-11 DIAGNOSIS — M1711 Unilateral primary osteoarthritis, right knee: Secondary | ICD-10-CM | POA: Diagnosis not present

## 2017-02-10 DIAGNOSIS — M1711 Unilateral primary osteoarthritis, right knee: Secondary | ICD-10-CM | POA: Diagnosis not present

## 2017-02-10 DIAGNOSIS — Z4789 Encounter for other orthopedic aftercare: Secondary | ICD-10-CM | POA: Diagnosis not present

## 2017-02-22 DIAGNOSIS — M1711 Unilateral primary osteoarthritis, right knee: Secondary | ICD-10-CM | POA: Diagnosis not present

## 2017-03-13 DIAGNOSIS — M1712 Unilateral primary osteoarthritis, left knee: Secondary | ICD-10-CM | POA: Diagnosis not present

## 2017-03-13 DIAGNOSIS — M25471 Effusion, right ankle: Secondary | ICD-10-CM | POA: Diagnosis not present

## 2017-03-13 DIAGNOSIS — M1711 Unilateral primary osteoarthritis, right knee: Secondary | ICD-10-CM | POA: Diagnosis not present

## 2017-03-13 DIAGNOSIS — M25561 Pain in right knee: Secondary | ICD-10-CM | POA: Diagnosis not present

## 2017-05-26 DIAGNOSIS — G8929 Other chronic pain: Secondary | ICD-10-CM | POA: Diagnosis not present

## 2017-05-26 DIAGNOSIS — M25561 Pain in right knee: Secondary | ICD-10-CM | POA: Diagnosis not present

## 2017-05-26 DIAGNOSIS — M545 Low back pain: Secondary | ICD-10-CM | POA: Diagnosis not present

## 2017-05-26 DIAGNOSIS — I10 Essential (primary) hypertension: Secondary | ICD-10-CM | POA: Diagnosis not present

## 2017-07-07 DIAGNOSIS — J0140 Acute pansinusitis, unspecified: Secondary | ICD-10-CM | POA: Diagnosis not present

## 2017-07-21 DIAGNOSIS — Z23 Encounter for immunization: Secondary | ICD-10-CM | POA: Diagnosis not present

## 2017-07-25 DIAGNOSIS — J339 Nasal polyp, unspecified: Secondary | ICD-10-CM | POA: Diagnosis not present

## 2017-07-25 DIAGNOSIS — R221 Localized swelling, mass and lump, neck: Secondary | ICD-10-CM | POA: Diagnosis not present

## 2017-07-25 DIAGNOSIS — Z7289 Other problems related to lifestyle: Secondary | ICD-10-CM | POA: Diagnosis not present

## 2017-07-25 DIAGNOSIS — Z87891 Personal history of nicotine dependence: Secondary | ICD-10-CM | POA: Diagnosis not present

## 2017-07-25 DIAGNOSIS — D385 Neoplasm of uncertain behavior of other respiratory organs: Secondary | ICD-10-CM | POA: Diagnosis not present

## 2017-07-26 DIAGNOSIS — Z7289 Other problems related to lifestyle: Secondary | ICD-10-CM | POA: Diagnosis not present

## 2017-07-26 DIAGNOSIS — J338 Other polyp of sinus: Secondary | ICD-10-CM | POA: Diagnosis not present

## 2017-07-26 DIAGNOSIS — Z87891 Personal history of nicotine dependence: Secondary | ICD-10-CM | POA: Diagnosis not present

## 2017-07-26 DIAGNOSIS — J339 Nasal polyp, unspecified: Secondary | ICD-10-CM | POA: Diagnosis not present

## 2017-08-17 DIAGNOSIS — J339 Nasal polyp, unspecified: Secondary | ICD-10-CM | POA: Diagnosis not present

## 2017-08-17 DIAGNOSIS — D385 Neoplasm of uncertain behavior of other respiratory organs: Secondary | ICD-10-CM | POA: Diagnosis not present

## 2017-08-23 DIAGNOSIS — J338 Other polyp of sinus: Secondary | ICD-10-CM | POA: Diagnosis not present

## 2017-08-23 DIAGNOSIS — J342 Deviated nasal septum: Secondary | ICD-10-CM | POA: Diagnosis not present

## 2017-08-23 DIAGNOSIS — J3489 Other specified disorders of nose and nasal sinuses: Secondary | ICD-10-CM | POA: Diagnosis not present

## 2017-08-23 DIAGNOSIS — J329 Chronic sinusitis, unspecified: Secondary | ICD-10-CM | POA: Diagnosis not present

## 2017-12-19 DIAGNOSIS — T162XXA Foreign body in left ear, initial encounter: Secondary | ICD-10-CM | POA: Diagnosis not present

## 2017-12-19 DIAGNOSIS — H6980 Other specified disorders of Eustachian tube, unspecified ear: Secondary | ICD-10-CM | POA: Diagnosis not present

## 2017-12-23 DIAGNOSIS — M170 Bilateral primary osteoarthritis of knee: Secondary | ICD-10-CM | POA: Diagnosis not present

## 2017-12-23 DIAGNOSIS — M1711 Unilateral primary osteoarthritis, right knee: Secondary | ICD-10-CM | POA: Diagnosis not present

## 2018-01-17 DIAGNOSIS — M25561 Pain in right knee: Secondary | ICD-10-CM | POA: Diagnosis not present

## 2018-01-22 ENCOUNTER — Other Ambulatory Visit: Payer: Self-pay | Admitting: Orthopaedic Surgery

## 2018-01-30 NOTE — Pre-Procedure Instructions (Signed)
Jill Alexanders  01/30/2018      [id number] [address], [place] [address] Phone: [phone] Fax: [phone]    Your procedure is scheduled on Feb 06, 2018.  Report to [facility] Admitting at 1050 AM.  Call this number if you have problems the morning of surgery:  [phone]   Continue all medications as directed by your physician except follow these medication instructions before surgery below   Remember:  Do not eat food or drink liquids after midnight.  Take these medicines the morning of surgery with A SIP OF WATER amlodipine (norvasc) Fluticasone (flonase) Oxycodone-acetaminophen (percocet)  7 days prior to surgery STOP taking any diclofenac (voltaren), Aspirin (unless otherwise instructed by your surgeon), Aleve, Naproxen, Ibuprofen, Motrin, Advil, Goody's, BC's, all herbal medications, fish oil, and all vitamins   Contacts, dentures or bridgework may not be worn into surgery.  Leave your suitcase in the car.  After surgery it may be brought to your room.  For patients admitted to the hospital, discharge time will be determined by your treatment team.  Patients discharged the day of surgery will not be allowed to drive home.   [place]- Preparing For Surgery  Before surgery, you can play an important role. Because skin is not sterile, your skin needs to be as free of germs as possible. You can reduce the number of germs on your skin by washing with CHG (chlorahexidine gluconate) Soap before surgery.  CHG is an antiseptic cleaner which kills germs and bonds with the skin to continue killing germs even after washing.  Please do not use if you have an allergy to CHG or antibacterial soaps. If your skin becomes reddened/irritated stop using the CHG.  Do not shave (including legs and underarms) for at least 48 hours prior to first CHG shower. It is OK to shave your face.  Please follow these  instructions carefully.   1. Shower the NIGHT BEFORE SURGERY and the MORNING OF SURGERY with CHG.   2. If you chose to wash your hair, wash your hair first as usual with your normal shampoo.  3. After you shampoo, rinse your hair and body thoroughly to remove the shampoo.  4. Use CHG as you would any other liquid soap. You can apply CHG directly to the skin and wash gently with a scrungie or a clean washcloth.   5. Apply the CHG Soap to your body ONLY FROM THE NECK DOWN.  Do not use on open wounds or open sores. Avoid contact with your eyes, ears, mouth and genitals (private parts). Wash Face and genitals (private parts)  with your normal soap.  6. Wash thoroughly, paying special attention to the area where your surgery will be performed.  7. Thoroughly rinse your body with warm water from the neck down.  8. DO NOT shower/wash with your normal soap after using and rinsing off the CHG Soap.  9. Pat yourself dry with a CLEAN TOWEL.  10. Wear CLEAN PAJAMAS to bed the night before surgery, wear comfortable clothes the morning of surgery  11. Place CLEAN SHEETS on your bed the night of your first shower and DO NOT SLEEP WITH PETS.  Day of Surgery: Do not apply any deodorants/lotions. Please wear clean clothes to the hospital/surgery center.     Do not wear jewelry.  Do not wear lotions, powders, or colognes, or deodorant.  Men may shave face and neck.  Do  not bring valuables to the hospital.  [facility] is not responsible for any belongings or valuables.  Please read over the following fact sheets that you were given. Pain Booklet, Coughing and Deep Breathing, MRSA Information and Surgical Site Infection Prevention

## 2018-01-30 NOTE — Progress Notes (Addendum)
PCP: Anastasia Pall, MD  Cardiologist: pt denies  EKG: 01/31/18  Stress test: pt denies ever  ECHO: pt denies ever  Cardiac Cath: pt denies ever  Chest x-ray:01/31/18

## 2018-01-31 ENCOUNTER — Other Ambulatory Visit: Payer: Self-pay

## 2018-01-31 ENCOUNTER — Encounter (HOSPITAL_COMMUNITY): Payer: Self-pay

## 2018-01-31 ENCOUNTER — Encounter (HOSPITAL_COMMUNITY)
Admission: RE | Admit: 2018-01-31 | Discharge: 2018-01-31 | Disposition: A | Discharge status: Home / Self Care | Payer: Medicare Other | Source: Ambulatory Visit | Attending: Orthopaedic Surgery | Admitting: Orthopaedic Surgery

## 2018-01-31 ENCOUNTER — Ambulatory Visit (HOSPITAL_COMMUNITY)
Admission: RE | Admit: 2018-01-31 | Discharge: 2018-01-31 | Disposition: A | Discharge status: Home / Self Care | Payer: Medicare Other | Source: Ambulatory Visit | Attending: Orthopedic Surgery | Admitting: Orthopedic Surgery

## 2018-01-31 DIAGNOSIS — I10 Essential (primary) hypertension: Secondary | ICD-10-CM | POA: Diagnosis not present

## 2018-01-31 DIAGNOSIS — Z01818 Encounter for other preprocedural examination: Secondary | ICD-10-CM

## 2018-01-31 DIAGNOSIS — Z01812 Encounter for preprocedural laboratory examination: Secondary | ICD-10-CM | POA: Diagnosis not present

## 2018-01-31 DIAGNOSIS — Z01810 Encounter for preprocedural cardiovascular examination: Secondary | ICD-10-CM | POA: Diagnosis not present

## 2018-01-31 DIAGNOSIS — M1711 Unilateral primary osteoarthritis, right knee: Secondary | ICD-10-CM | POA: Insufficient documentation

## 2018-01-31 HISTORY — DX: Unspecified osteoarthritis, unspecified site: M19.90

## 2018-01-31 LAB — URINALYSIS, ROUTINE W REFLEX MICROSCOPIC
Glucose, UA: NEGATIVE mg/dL
HGB URINE DIPSTICK: NEGATIVE
KETONES UR: 5 mg/dL — AB
LEUKOCYTES UA: NEGATIVE
Nitrite: NEGATIVE
PH: 5 (ref 5.0–8.0)
Protein, ur: NEGATIVE mg/dL
SPECIFIC GRAVITY, URINE: 1.024 (ref 1.005–1.030)

## 2018-01-31 LAB — CBC WITH DIFFERENTIAL/PLATELET
Basophils Absolute: 0 10*3/uL (ref 0.0–0.1)
Basophils Relative: 0 %
EOS ABS: 0.1 10*3/uL (ref 0.0–0.7)
EOS PCT: 1 %
HCT: 50.3 % (ref 39.0–52.0)
HEMOGLOBIN: 16.6 g/dL (ref 13.0–17.0)
LYMPHS PCT: 17 %
Lymphs Abs: 2.1 10*3/uL (ref 0.7–4.0)
MCH: 31 pg (ref 26.0–34.0)
MCHC: 33 g/dL (ref 30.0–36.0)
MCV: 94 fL (ref 78.0–100.0)
MONOS PCT: 9 %
Monocytes Absolute: 1 10*3/uL (ref 0.1–1.0)
NEUTROS PCT: 73 %
Neutro Abs: 8.8 10*3/uL — ABNORMAL HIGH (ref 1.7–7.7)
Platelets: 208 10*3/uL (ref 150–400)
RBC: 5.35 MIL/uL (ref 4.22–5.81)
RDW: 14.3 % (ref 11.5–15.5)
WBC: 12.1 10*3/uL — ABNORMAL HIGH (ref 4.0–10.5)

## 2018-01-31 LAB — BASIC METABOLIC PANEL
Anion gap: 13 (ref 5–15)
BUN: 8 mg/dL (ref 6–20)
CO2: 25 mmol/L (ref 22–32)
CREATININE: 0.82 mg/dL (ref 0.61–1.24)
Calcium: 9.3 mg/dL (ref 8.9–10.3)
Chloride: 100 mmol/L — ABNORMAL LOW (ref 101–111)
GFR calc Af Amer: >60 mL/min (ref >60)
GFR calc non Af Amer: >60 mL/min (ref >60)
Glucose, Bld: 103 mg/dL — ABNORMAL HIGH (ref 65–99)
POTASSIUM: 4.2 mmol/L (ref 3.5–5.1)
Sodium: 138 mmol/L (ref 135–145)

## 2018-01-31 LAB — TYPE AND SCREEN
ABO/RH(D): A POS
Antibody Screen: NEGATIVE

## 2018-01-31 LAB — SURGICAL PCR SCREEN
MRSA, PCR: NEGATIVE
Staphylococcus aureus: NEGATIVE

## 2018-01-31 LAB — ABO/RH: ABO/RH(D): A POS

## 2018-01-31 LAB — PROTIME-INR
INR: 1.03
Prothrombin Time: 13.4 seconds (ref 11.4–15.2)

## 2018-01-31 LAB — APTT: aPTT: 29 seconds (ref 24–36)

## 2018-01-31 NOTE — H&P (Signed)
TOTAL KNEE ADMISSION H&P  Patient is being admitted for right total knee arthroplasty.  Subjective:  Chief Complaint:right knee pain.  HPI: Anthony Wiley, 72 y.o. male, has a history of pain and functional disability in the right knee due to arthritis and has failed non-surgical conservative treatments for greater than 12 weeks to includeNSAID's and/or analgesics, corticosteriod injections, viscosupplementation injections, flexibility and strengthening excercises, use of assistive devices, weight reduction as appropriate and activity modification.  Onset of symptoms was gradual, starting 5 years ago with gradually worsening course since that time. The patient noted no past surgery on the right knee(s).  Patient currently rates pain in the right knee(s) at 10 out of 10 with activity. Patient has night pain, worsening of pain with activity and weight bearing, pain that interferes with activities of daily living, crepitus and joint swelling.  Patient has evidence of subchondral cysts, subchondral sclerosis, periarticular osteophytes and joint space narrowing by imaging studies. There is no active infection.  There are no active problems to display for this patient.  Past Medical History:  Diagnosis Date  . Arthritis   . Hypertension     Past Surgical History:  Procedure Laterality Date  . BACK SURGERY    . KNEE ARTHROSCOPY Right   . NASAL SINUS SURGERY      No current facility-administered medications for this encounter.    Current Outpatient Medications  Medication Sig Dispense Refill Last Dose  . amLODipine (NORVASC) 10 MG tablet Take 10 mg by mouth daily.   11/03/2011 at Unknown  . diclofenac (VOLTAREN) 75 MG EC tablet Take 75 mg by mouth 2 (two) times daily.  0   . fluticasone (FLONASE) 50 MCG/ACT nasal spray Place 1 spray into the nose daily as needed for allergies.    11/02/2011 at Unknown  . naproxen sodium (ALEVE) 220 MG tablet Take 220-440 mg by mouth 2 (two) times daily as  needed.     Marland Kitchen oxyCODONE-acetaminophen (PERCOCET/ROXICET) 5-325 MG tablet Take 1 tablet by mouth 3 (three) times daily as needed for pain.  0    No Known Allergies  Social History   Tobacco Use  . Smoking status: Former Smoker    Last attempt to quit: 10/03/1997    Years since quitting: 20.3  . Smokeless tobacco: Never Used  Substance Use Topics  . Alcohol use: Yes    Comment: rare    No family history on file.   Review of Systems  Musculoskeletal: Positive for joint pain.       Right knee  All other systems reviewed and are negative.   Objective:  Physical Exam  Constitutional: He is oriented to person, place, and time. He appears well-developed and well-nourished.  HENT:  Head: Normocephalic.  Eyes: Pupils are equal, round, and reactive to light.  Neck: Normal range of motion.  Cardiovascular: Normal rate and regular rhythm.  Respiratory: Effort normal.  GI: Soft.  Musculoskeletal:  Right knee motion is about 5-110.  There is no gross deformity.  He has some crepitation and medial joint line pain.  I do not feel an effusion.  There are no scars.  Calf is soft and nontender.  Hip motion is full and straight leg raise is negative.  Neurological: He is alert and oriented to person, place, and time.  Skin: Skin is warm and dry.  Psychiatric: He has a normal mood and affect. His behavior is normal. Judgment and thought content normal.    Vital signs in last 24 hours: Temp:  [  98 F (36.7 C)] 98 F (36.7 C) (05/01 0931) Pulse Rate:  [94] 94 (05/01 0931) Resp:  [20] 20 (05/01 0931) BP: (138-162)/(80-95) 138/80 (05/01 0952) SpO2:  [100 %] 100 % (05/01 0931) Weight:  [99.5 kg (219 lb 6.4 oz)] 99.5 kg (219 lb 6.4 oz) (05/01 0931)  Labs:   Estimated body mass index is 35.41 kg/m as calculated from the following:   Height as of 01/31/18: 5\' 6"  (1.676 m).   Weight as of 01/31/18: 99.5 kg (219 lb 6.4 oz).   Imaging Review Plain radiographs demonstrate severe degenerative  joint disease of the right knee(s). The overall alignment isneutral. The bone quality appears to be good for age and reported activity level.   Preoperative templating of the joint replacement has been completed, documented, and submitted to the Operating Room personnel in order to optimize intra-operative equipment management.   Anticipated LOS equal to or greater than 2 midnights due to - Age 25 and older with one or more of the following:  - Obesity  - Expected need for hospital services (PT, OT, Nursing) required for safe  discharge  - Anticipated need for postoperative skilled nursing care or inpatient rehab  - Active co-morbidities: Chronic pain requiring opiods  Assessment/Plan:  End stage primary arthritis, right knee   The patient history, physical examination, clinical judgment of the provider and imaging studies are consistent with end stage degenerative joint disease of the right knee(s) and total knee arthroplasty is deemed medically necessary. The treatment options including medical management, injection therapy arthroscopy and arthroplasty were discussed at length. The risks and benefits of total knee arthroplasty were presented and reviewed. The risks due to aseptic loosening, infection, stiffness, patella tracking problems, thromboembolic complications and other imponderables were discussed. The patient acknowledged the explanation, agreed to proceed with the plan and consent was signed. Patient is being admitted for inpatient treatment for surgery, pain control, PT, OT, prophylactic antibiotics, VTE prophylaxis, progressive ambulation and ADL's and discharge planning. The patient is planning to be discharged home with home health services

## 2018-02-02 ENCOUNTER — Other Ambulatory Visit: Payer: Self-pay | Admitting: Orthopaedic Surgery

## 2018-02-02 NOTE — Care Plan (Signed)
Spoke with patient prior to surgery. He plans to discharge to home with family support and HHPT provided by [facility] at home. He is scheduled to follow up with Dr. Rhona Raider on 02/16/18 at 89 and transition to [place] following that appointment. I have ordered him a rolling walker to be delivered to him in the hospital room prior to delivery.   Please contact Ladell Heads, [place] with questions or if this plan needs to change.   Thank you

## 2018-02-05 MED ORDER — CEFAZOLIN SODIUM-DEXTROSE 2-4 GM/100ML-% IV SOLN
2.0000 g | INTRAVENOUS | Status: AC
  Administered 2018-02-06: 2 g via INTRAVENOUS
  Filled 2018-02-05: qty 100

## 2018-02-05 MED ORDER — LACTATED RINGERS IV SOLN
INTRAVENOUS | Status: DC
  Administered 2018-02-06: 11:00:00 via INTRAVENOUS

## 2018-02-05 MED ORDER — TRANEXAMIC ACID 1000 MG/10ML IV SOLN
2000.0000 mg | INTRAVENOUS | Status: AC
  Administered 2018-02-06: 2000 mg via TOPICAL
  Filled 2018-02-05: qty 20

## 2018-02-05 MED ORDER — TRANEXAMIC ACID 1000 MG/10ML IV SOLN
1000.0000 mg | INTRAVENOUS | Status: AC
  Administered 2018-02-06: 1000 mg via INTRAVENOUS
  Filled 2018-02-05: qty 1100

## 2018-02-06 ENCOUNTER — Encounter (HOSPITAL_COMMUNITY)
Admission: RE | Disposition: A | Discharge status: Home Health | Payer: Self-pay | Source: Ambulatory Visit | Attending: Orthopaedic Surgery

## 2018-02-06 ENCOUNTER — Inpatient Hospital Stay (HOSPITAL_COMMUNITY): Payer: Medicare Other | Admitting: Certified Registered"

## 2018-02-06 ENCOUNTER — Encounter (HOSPITAL_COMMUNITY): Payer: Self-pay | Admitting: *Deleted

## 2018-02-06 ENCOUNTER — Inpatient Hospital Stay (HOSPITAL_COMMUNITY)
Admission: RE | Admit: 2018-02-06 | Discharge: 2018-02-10 | DRG: 470 | Disposition: A | Discharge status: Home Health | Payer: Medicare Other | Source: Ambulatory Visit | Attending: Orthopaedic Surgery | Admitting: Orthopaedic Surgery

## 2018-02-06 DIAGNOSIS — Z87891 Personal history of nicotine dependence: Secondary | ICD-10-CM | POA: Diagnosis not present

## 2018-02-06 DIAGNOSIS — I10 Essential (primary) hypertension: Secondary | ICD-10-CM | POA: Diagnosis not present

## 2018-02-06 DIAGNOSIS — M8568 Other cyst of bone, other site: Secondary | ICD-10-CM | POA: Diagnosis not present

## 2018-02-06 DIAGNOSIS — Z888 Allergy status to other drugs, medicaments and biological substances status: Secondary | ICD-10-CM | POA: Diagnosis not present

## 2018-02-06 DIAGNOSIS — Z791 Long term (current) use of non-steroidal anti-inflammatories (NSAID): Secondary | ICD-10-CM

## 2018-02-06 DIAGNOSIS — G8918 Other acute postprocedural pain: Secondary | ICD-10-CM | POA: Diagnosis not present

## 2018-02-06 DIAGNOSIS — E669 Obesity, unspecified: Secondary | ICD-10-CM | POA: Diagnosis present

## 2018-02-06 DIAGNOSIS — M1711 Unilateral primary osteoarthritis, right knee: Secondary | ICD-10-CM | POA: Diagnosis not present

## 2018-02-06 DIAGNOSIS — Z6835 Body mass index (BMI) 35.0-35.9, adult: Secondary | ICD-10-CM | POA: Diagnosis not present

## 2018-02-06 DIAGNOSIS — Z79899 Other long term (current) drug therapy: Secondary | ICD-10-CM | POA: Diagnosis not present

## 2018-02-06 HISTORY — PX: TOTAL KNEE ARTHROPLASTY: SHX125

## 2018-02-06 SURGERY — ARTHROPLASTY, KNEE, TOTAL
Anesthesia: Spinal | Laterality: Right

## 2018-02-06 MED ORDER — MIDAZOLAM HCL 2 MG/2ML IJ SOLN
INTRAMUSCULAR | Status: AC
  Administered 2018-02-06: 2 mg
  Filled 2018-02-06: qty 2

## 2018-02-06 MED ORDER — ALUM & MAG HYDROXIDE-SIMETH 200-200-20 MG/5ML PO SUSP
30.0000 mL | ORAL | Status: DC | PRN
  Administered 2018-02-07: 30 mL via ORAL
  Filled 2018-02-06: qty 30

## 2018-02-06 MED ORDER — SODIUM CHLORIDE 0.9 % IR SOLN
Status: DC | PRN
  Administered 2018-02-06: 3000 mL

## 2018-02-06 MED ORDER — METOCLOPRAMIDE HCL 5 MG/ML IJ SOLN: 5.0000 mg | Freq: Three times a day (TID) | INTRAMUSCULAR | Status: DC | PRN

## 2018-02-06 MED ORDER — HYDROMORPHONE HCL 2 MG/ML IJ SOLN: 0.2500 mg | INTRAMUSCULAR | Status: DC | PRN

## 2018-02-06 MED ORDER — AMLODIPINE BESYLATE 10 MG PO TABS
10.0000 mg | ORAL_TABLET | Freq: Every day | ORAL | Status: DC
  Administered 2018-02-07 – 2018-02-10 (×4): 10 mg via ORAL
  Filled 2018-02-06 (×4): qty 1

## 2018-02-06 MED ORDER — DEXAMETHASONE SODIUM PHOSPHATE 10 MG/ML IJ SOLN
INTRAMUSCULAR | Status: DC | PRN
  Administered 2018-02-06: 10 mg via INTRAVENOUS

## 2018-02-06 MED ORDER — DEXAMETHASONE SODIUM PHOSPHATE 10 MG/ML IJ SOLN
INTRAMUSCULAR | Status: AC
  Filled 2018-02-06: qty 1

## 2018-02-06 MED ORDER — OXYCODONE HCL 5 MG PO TABS
10.0000 mg | ORAL_TABLET | ORAL | Status: DC | PRN
  Administered 2018-02-06: 10 mg via ORAL
  Administered 2018-02-07 – 2018-02-10 (×11): 15 mg via ORAL
  Filled 2018-02-06 (×2): qty 3
  Filled 2018-02-06: qty 2
  Filled 2018-02-06: qty 3
  Filled 2018-02-06: qty 2
  Filled 2018-02-06 (×8): qty 3

## 2018-02-06 MED ORDER — BUPIVACAINE-EPINEPHRINE (PF) 0.5% -1:200000 IJ SOLN
INTRAMUSCULAR | Status: DC | PRN
  Administered 2018-02-06: 30 mL

## 2018-02-06 MED ORDER — BUPIVACAINE-EPINEPHRINE (PF) 0.5% -1:200000 IJ SOLN
INTRAMUSCULAR | Status: AC
  Filled 2018-02-06: qty 30

## 2018-02-06 MED ORDER — METHOCARBAMOL 1000 MG/10ML IJ SOLN
500.0000 mg | Freq: Four times a day (QID) | INTRAVENOUS | Status: DC | PRN
  Filled 2018-02-06: qty 5

## 2018-02-06 MED ORDER — METOCLOPRAMIDE HCL 5 MG PO TABS: 5.0000 mg | ORAL_TABLET | Freq: Three times a day (TID) | ORAL | Status: DC | PRN

## 2018-02-06 MED ORDER — OXYCODONE HCL 5 MG PO TABS
5.0000 mg | ORAL_TABLET | ORAL | Status: DC | PRN
  Administered 2018-02-06 – 2018-02-08 (×4): 10 mg via ORAL
  Filled 2018-02-06 (×2): qty 2

## 2018-02-06 MED ORDER — BISACODYL 5 MG PO TBEC: 5.0000 mg | DELAYED_RELEASE_TABLET | Freq: Every day | ORAL | Status: DC | PRN

## 2018-02-06 MED ORDER — METHOCARBAMOL 500 MG PO TABS
500.0000 mg | ORAL_TABLET | Freq: Four times a day (QID) | ORAL | Status: DC | PRN
  Administered 2018-02-06 – 2018-02-10 (×6): 500 mg via ORAL
  Filled 2018-02-06 (×5): qty 1

## 2018-02-06 MED ORDER — BUPIVACAINE LIPOSOME 1.3 % IJ SUSP
20.0000 mL | INTRAMUSCULAR | Status: DC
  Filled 2018-02-06: qty 20

## 2018-02-06 MED ORDER — FLUTICASONE PROPIONATE 50 MCG/ACT NA SUSP
1.0000 | Freq: Every day | NASAL | Status: DC
  Administered 2018-02-07 – 2018-02-10 (×4): 1 via NASAL
  Filled 2018-02-06: qty 16

## 2018-02-06 MED ORDER — TRANEXAMIC ACID 1000 MG/10ML IV SOLN
1000.0000 mg | Freq: Once | INTRAVENOUS | Status: AC
  Administered 2018-02-06: 1000 mg via INTRAVENOUS
  Filled 2018-02-06: qty 10

## 2018-02-06 MED ORDER — KETOROLAC TROMETHAMINE 30 MG/ML IJ SOLN
30.0000 mg | Freq: Once | INTRAMUSCULAR | Status: AC | PRN
  Administered 2018-02-06: 30 mg via INTRAVENOUS

## 2018-02-06 MED ORDER — MEPERIDINE HCL 50 MG/ML IJ SOLN
6.2500 mg | INTRAMUSCULAR | Status: DC | PRN
Start: 2018-02-06 — End: 2018-02-06

## 2018-02-06 MED ORDER — CHLORHEXIDINE GLUCONATE 4 % EX LIQD: 60.0000 mL | Freq: Once | CUTANEOUS | Status: DC

## 2018-02-06 MED ORDER — PHENOL 1.4 % MT LIQD: 1.0000 | OROMUCOSAL | Status: DC | PRN

## 2018-02-06 MED ORDER — HYDROMORPHONE HCL 2 MG/ML IJ SOLN
0.5000 mg | INTRAMUSCULAR | Status: DC | PRN
  Administered 2018-02-06: 1 mg via INTRAVENOUS
  Filled 2018-02-06 (×2): qty 1

## 2018-02-06 MED ORDER — DIPHENHYDRAMINE HCL 12.5 MG/5ML PO ELIX: 12.5000 mg | ORAL_SOLUTION | ORAL | Status: DC | PRN

## 2018-02-06 MED ORDER — DOCUSATE SODIUM 100 MG PO CAPS
100.0000 mg | ORAL_CAPSULE | Freq: Two times a day (BID) | ORAL | Status: DC
  Administered 2018-02-06 – 2018-02-10 (×8): 100 mg via ORAL
  Filled 2018-02-06 (×8): qty 1

## 2018-02-06 MED ORDER — KETOROLAC TROMETHAMINE 30 MG/ML IJ SOLN
INTRAMUSCULAR | Status: AC
  Filled 2018-02-06: qty 1

## 2018-02-06 MED ORDER — PROPOFOL 10 MG/ML IV BOLUS
INTRAVENOUS | Status: AC
Start: 2018-02-06 — End: 2018-02-06
  Filled 2018-02-06: qty 20

## 2018-02-06 MED ORDER — ASPIRIN EC 325 MG PO TBEC
325.0000 mg | DELAYED_RELEASE_TABLET | Freq: Two times a day (BID) | ORAL | Status: DC
  Administered 2018-02-07 – 2018-02-10 (×7): 325 mg via ORAL
  Filled 2018-02-06 (×7): qty 1

## 2018-02-06 MED ORDER — ACETAMINOPHEN 325 MG PO TABS: 325.0000 mg | ORAL_TABLET | Freq: Four times a day (QID) | ORAL | Status: DC | PRN

## 2018-02-06 MED ORDER — PROPOFOL 10 MG/ML IV BOLUS
INTRAVENOUS | Status: DC | PRN
  Administered 2018-02-06: 20 mg via INTRAVENOUS
  Administered 2018-02-06: 30 mg via INTRAVENOUS

## 2018-02-06 MED ORDER — ONDANSETRON HCL 4 MG/2ML IJ SOLN
INTRAMUSCULAR | Status: AC
  Filled 2018-02-06: qty 2

## 2018-02-06 MED ORDER — LACTATED RINGERS IV SOLN
INTRAVENOUS | Status: DC
Start: 2018-02-06 — End: 2018-02-06
  Administered 2018-02-06 (×2): via INTRAVENOUS

## 2018-02-06 MED ORDER — METHOCARBAMOL 500 MG PO TABS
ORAL_TABLET | ORAL | Status: AC
  Filled 2018-02-06: qty 1

## 2018-02-06 MED ORDER — PROMETHAZINE HCL 25 MG/ML IJ SOLN: 6.2500 mg | INTRAMUSCULAR | Status: DC | PRN

## 2018-02-06 MED ORDER — PHENYLEPHRINE HCL 10 MG/ML IJ SOLN
INTRAVENOUS | Status: DC | PRN
  Administered 2018-02-06: 50 ug/min via INTRAVENOUS

## 2018-02-06 MED ORDER — ONDANSETRON HCL 4 MG/2ML IJ SOLN: 4.0000 mg | Freq: Four times a day (QID) | INTRAMUSCULAR | Status: DC | PRN

## 2018-02-06 MED ORDER — CEFAZOLIN SODIUM-DEXTROSE 2-4 GM/100ML-% IV SOLN
2.0000 g | Freq: Four times a day (QID) | INTRAVENOUS | Status: AC
  Administered 2018-02-06 – 2018-02-07 (×2): 2 g via INTRAVENOUS
  Filled 2018-02-06 (×2): qty 100

## 2018-02-06 MED ORDER — ONDANSETRON HCL 4 MG/2ML IJ SOLN
INTRAMUSCULAR | Status: DC | PRN
  Administered 2018-02-06: 4 mg via INTRAVENOUS

## 2018-02-06 MED ORDER — FENTANYL CITRATE (PF) 250 MCG/5ML IJ SOLN
INTRAMUSCULAR | Status: AC
  Filled 2018-02-06: qty 5

## 2018-02-06 MED ORDER — FENTANYL CITRATE (PF) 100 MCG/2ML IJ SOLN
INTRAMUSCULAR | Status: AC
  Filled 2018-02-06: qty 2

## 2018-02-06 MED ORDER — KETOROLAC TROMETHAMINE 15 MG/ML IJ SOLN
7.5000 mg | Freq: Four times a day (QID) | INTRAMUSCULAR | Status: AC
  Administered 2018-02-06 – 2018-02-07 (×4): 7.5 mg via INTRAVENOUS
  Filled 2018-02-06 (×3): qty 1

## 2018-02-06 MED ORDER — ONDANSETRON HCL 4 MG PO TABS: 4.0000 mg | ORAL_TABLET | Freq: Four times a day (QID) | ORAL | Status: DC | PRN

## 2018-02-06 MED ORDER — ROPIVACAINE HCL 5 MG/ML IJ SOLN
INTRAMUSCULAR | Status: DC | PRN
  Administered 2018-02-06 (×6): 5 mL via PERINEURAL

## 2018-02-06 MED ORDER — ACETAMINOPHEN 500 MG PO TABS
1000.0000 mg | ORAL_TABLET | Freq: Four times a day (QID) | ORAL | Status: AC
  Administered 2018-02-06 – 2018-02-07 (×4): 1000 mg via ORAL
  Filled 2018-02-06 (×3): qty 2

## 2018-02-06 MED ORDER — MENTHOL 3 MG MT LOZG: 1.0000 | LOZENGE | OROMUCOSAL | Status: DC | PRN

## 2018-02-06 MED ORDER — 0.9 % SODIUM CHLORIDE (POUR BTL) OPTIME
TOPICAL | Status: DC | PRN
  Administered 2018-02-06: 1000 mL

## 2018-02-06 MED ORDER — PHENYLEPHRINE 40 MCG/ML (10ML) SYRINGE FOR IV PUSH (FOR BLOOD PRESSURE SUPPORT)
PREFILLED_SYRINGE | INTRAVENOUS | Status: AC
  Filled 2018-02-06: qty 20

## 2018-02-06 MED ORDER — LACTATED RINGERS IV SOLN: INTRAVENOUS | Status: DC

## 2018-02-06 MED ORDER — BUPIVACAINE LIPOSOME 1.3 % IJ SUSP
INTRAMUSCULAR | Status: DC | PRN
  Administered 2018-02-06: 20 mL

## 2018-02-06 MED ORDER — OXYCODONE HCL 5 MG PO TABS
ORAL_TABLET | ORAL | Status: AC
  Filled 2018-02-06: qty 2

## 2018-02-06 MED ORDER — MIDAZOLAM HCL 2 MG/2ML IJ SOLN
INTRAMUSCULAR | Status: AC
  Filled 2018-02-06: qty 2

## 2018-02-06 MED ORDER — FENTANYL CITRATE (PF) 100 MCG/2ML IJ SOLN
INTRAMUSCULAR | Status: AC
  Administered 2018-02-06: 100 ug
  Filled 2018-02-06: qty 2

## 2018-02-06 MED ORDER — ROCURONIUM BROMIDE 50 MG/5ML IV SOLN
INTRAVENOUS | Status: AC
Start: 2018-02-06 — End: ?
  Filled 2018-02-06: qty 1

## 2018-02-06 MED ORDER — PROPOFOL 500 MG/50ML IV EMUL
INTRAVENOUS | Status: DC | PRN
  Administered 2018-02-06: 100 ug/kg/min via INTRAVENOUS

## 2018-02-06 SURGICAL SUPPLY — 57 items
BAG DECANTER FOR FLEXI CONT (MISCELLANEOUS) IMPLANT
BANDAGE ACE 6X5 VEL STRL LF (GAUZE/BANDAGES/DRESSINGS) ×2 IMPLANT
BANDAGE ESMARK 6X9 LF (GAUZE/BANDAGES/DRESSINGS) ×1 IMPLANT
BLADE SAGITTAL 25.0X1.19X90 (BLADE) IMPLANT
BLADE SAGITTAL 25.0X1.19X90MM (BLADE)
BLADE SAW SGTL 13.0X1.19X90.0M (BLADE) IMPLANT
BNDG CMPR 9X6 STRL LF SNTH (GAUZE/BANDAGES/DRESSINGS) ×1
BNDG CMPR MED 10X6 ELC LF (GAUZE/BANDAGES/DRESSINGS) ×1
BNDG ELASTIC 6X10 VLCR STRL LF (GAUZE/BANDAGES/DRESSINGS) ×3 IMPLANT
BNDG ESMARK 6X9 LF (GAUZE/BANDAGES/DRESSINGS) ×3
BOWL SMART MIX CTS (DISPOSABLE) ×3 IMPLANT
CAP KNEE TOTAL 3 SIGMA ×3 IMPLANT
CEMENT HV SMART SET (Cement) ×6 IMPLANT
CLOSURE WOUND 1/2 X4 (GAUZE/BANDAGES/DRESSINGS) ×1
COVER SURGICAL LIGHT HANDLE (MISCELLANEOUS) ×3 IMPLANT
CUFF TOURNIQUET SINGLE 34IN LL (TOURNIQUET CUFF) ×3 IMPLANT
CUFF TOURNIQUET SINGLE 44IN (TOURNIQUET CUFF) IMPLANT
DECANTER SPIKE VIAL GLASS SM (MISCELLANEOUS) ×3 IMPLANT
DRAPE EXTREMITY T 121X128X90 (DRAPE) ×3 IMPLANT
DRAPE HALF SHEET 40X57 (DRAPES) ×6 IMPLANT
DRAPE U-SHAPE 47X51 STRL (DRAPES) ×3 IMPLANT
DRESSING AQUACEL AG SP 3.5X10 (GAUZE/BANDAGES/DRESSINGS) IMPLANT
DRSG AQUACEL AG ADV 3.5X10 (GAUZE/BANDAGES/DRESSINGS) ×3 IMPLANT
DRSG AQUACEL AG SP 3.5X10 (GAUZE/BANDAGES/DRESSINGS) ×3
DURAPREP 26ML APPLICATOR (WOUND CARE) ×3 IMPLANT
ELECT REM PT RETURN 9FT ADLT (ELECTROSURGICAL) ×3
ELECTRODE REM PT RTRN 9FT ADLT (ELECTROSURGICAL) ×1 IMPLANT
GLOVE BIO SURGEON STRL SZ8 (GLOVE) ×6 IMPLANT
GLOVE BIOGEL PI IND STRL 8 (GLOVE) ×2 IMPLANT
GLOVE BIOGEL PI INDICATOR 8 (GLOVE) ×4
GOWN STRL REUS W/ TWL LRG LVL3 (GOWN DISPOSABLE) ×1 IMPLANT
GOWN STRL REUS W/ TWL XL LVL3 (GOWN DISPOSABLE) ×2 IMPLANT
GOWN STRL REUS W/TWL LRG LVL3 (GOWN DISPOSABLE) ×3
GOWN STRL REUS W/TWL XL LVL3 (GOWN DISPOSABLE) ×6
HANDPIECE INTERPULSE COAX TIP (DISPOSABLE) ×3
HOOD PEEL AWAY FACE SHEILD DIS (HOOD) ×6 IMPLANT
IMMOBILIZER KNEE 22 (SOFTGOODS) ×2 IMPLANT
IMMOBILIZER KNEE 22 UNIV (SOFTGOODS) ×3 IMPLANT
KIT BASIN OR (CUSTOM PROCEDURE TRAY) ×3 IMPLANT
KIT TURNOVER KIT B (KITS) ×3 IMPLANT
MANIFOLD NEPTUNE II (INSTRUMENTS) ×3 IMPLANT
NEEDLE HYPO 21X1 ECLIPSE (NEEDLE) ×3 IMPLANT
NS IRRIG 1000ML POUR BTL (IV SOLUTION) ×3 IMPLANT
PACK TOTAL JOINT (CUSTOM PROCEDURE TRAY) ×3 IMPLANT
PAD ARMBOARD 7.5X6 YLW CONV (MISCELLANEOUS) ×6 IMPLANT
SET HNDPC FAN SPRY TIP SCT (DISPOSABLE) ×1 IMPLANT
STRIP CLOSURE SKIN 1/2X4 (GAUZE/BANDAGES/DRESSINGS) ×2 IMPLANT
SUT VIC AB 0 CT1 27 (SUTURE) ×3
SUT VIC AB 0 CT1 27XBRD ANBCTR (SUTURE) ×1 IMPLANT
SUT VIC AB 2-0 CT1 27 (SUTURE) ×3
SUT VIC AB 2-0 CT1 TAPERPNT 27 (SUTURE) ×1 IMPLANT
SUT VIC AB 3-0 FS2 27 (SUTURE) ×3 IMPLANT
SUT VLOC 180 0 24IN GS25 (SUTURE) ×3 IMPLANT
SYR 50ML LL SCALE MARK (SYRINGE) ×3 IMPLANT
TOWEL OR 17X24 6PK STRL BLUE (TOWEL DISPOSABLE) ×3 IMPLANT
TOWEL OR 17X26 10 PK STRL BLUE (TOWEL DISPOSABLE) ×3 IMPLANT
TRAY CATH 16FR W/PLASTIC CATH (SET/KITS/TRAYS/PACK) IMPLANT

## 2018-02-06 NOTE — Interval H&P Note (Signed)
History and Physical Interval Note:  02/06/2018 11:47 AM  Anthony Wiley  has presented today for surgery, with the diagnosis of RIGHT KNEE DEGENERATIVE JOINT DISEASE  The various methods of treatment have been discussed with the patient and family. After consideration of risks, benefits and other options for treatment, the patient has consented to  Procedure(s): TOTAL KNEE ARTHROPLASTY (Right) as a surgical intervention .  The patient's history has been reviewed, patient examined, no change in status, stable for surgery.  I have reviewed the patient's chart and labs.  Questions were answered to the patient's satisfaction.     Daveena Elmore G

## 2018-02-06 NOTE — Transfer of Care (Signed)
Immediate Anesthesia Transfer of Care Note  Patient: Anthony Wiley  Procedure(s) Performed: TOTAL KNEE ARTHROPLASTY (Right )  Patient Location: PACU  Anesthesia Type:MAC and Spinal  Level of Consciousness: awake and oriented  Airway & Oxygen Therapy: Patient Spontanous Breathing  Post-op Assessment: Report given to RN  Post vital signs: Reviewed and stable  Last Vitals:  Vitals Value Taken Time  BP 106/58 02/06/2018  2:58 PM  Temp    Pulse 73 02/06/2018  2:58 PM  Resp 16 02/06/2018  2:58 PM  SpO2 97 % 02/06/2018  2:58 PM  Vitals shown include unvalidated device data.  Last Pain:  Vitals:   02/06/18 1125  TempSrc: Oral  PainSc:          Complications: No apparent anesthesia complications

## 2018-02-06 NOTE — Anesthesia Procedure Notes (Signed)
Procedure Name: MAC Date/Time: 02/06/2018 12:47 PM Performed by: Barrington Ellison, CRNA Pre-anesthesia Checklist: Patient identified, Emergency Drugs available, Suction available, Patient being monitored and Timeout performed Patient Re-evaluated:Patient Re-evaluated prior to induction Oxygen Delivery Method: Simple face mask

## 2018-02-06 NOTE — Progress Notes (Signed)
RN received Pt and got him comfortable and set up, pt vital signs have been taken and have been inputed in epic, PT does not have any skin issues nor breathing problems. Pt satting 98 on Room air, no vision or cardic problems s1 and s2 audible. LBM 5/7, pt already voided 3x in PACU urinal at bedside.

## 2018-02-06 NOTE — Anesthesia Procedure Notes (Signed)
Anesthesia Regional Block: Adductor canal block   Pre-Anesthetic Checklist: ,, timeout performed, Correct Patient, Correct Site, Correct Laterality, Correct Procedure, Correct Position, site marked, Risks and benefits discussed,  Surgical consent,  Pre-op evaluation,  At surgeon's request and post-op pain management  Laterality: Right and Lower  Prep: chloraprep       Needles:  Injection technique: Single-shot  Needle Type: Echogenic Stimulator Needle     Needle Length: 10cm  Needle Gauge: 21   Needle insertion depth: 3 cm   Additional Needles:   Narrative:  Start time: 02/06/2018 12:05 PM End time: 02/06/2018 12:15 PM Injection made incrementally with aspirations every 5 mL.  Performed by: Personally  Anesthesiologist: Lyn Hollingshead, MD

## 2018-02-06 NOTE — Anesthesia Preprocedure Evaluation (Addendum)
Anesthesia Evaluation  Patient identified by MRN, date of birth, ID band Patient awake    Reviewed: Allergy & Precautions, NPO status , Patient's Chart, lab work & pertinent test results  Airway Mallampati: II       Dental no notable dental hx. (+) Teeth Intact, Dental Advisory Given   Pulmonary former smoker,    Pulmonary exam normal breath sounds clear to auscultation       Cardiovascular hypertension, Pt. on medications Normal cardiovascular exam Rhythm:Regular Rate:Normal     Neuro/Psych negative neurological ROS  negative psych ROS   GI/Hepatic negative GI ROS, Neg liver ROS,   Endo/Other  negative endocrine ROS  Renal/GU negative Renal ROS  negative genitourinary   Musculoskeletal   Abdominal (+) + obese,   Peds  Hematology negative hematology ROS (+)   Anesthesia Other Findings   Reproductive/Obstetrics                           Anesthesia Physical Anesthesia Plan  ASA: II  Anesthesia Plan: Spinal   Post-op Pain Management:  Regional for Post-op pain   Induction:   PONV Risk Score and Plan: 1 and Ondansetron and Dexamethasone  Airway Management Planned: Natural Airway, Nasal Cannula and Simple Face Mask  Additional Equipment:   Intra-op Plan:   Post-operative Plan:   Informed Consent: I have reviewed the patients History and Physical, chart, labs and discussed the procedure including the risks, benefits and alternatives for the proposed anesthesia with the patient or authorized representative who has indicated his/her understanding and acceptance.     Plan Discussed with: CRNA and Surgeon  Anesthesia Plan Comments:        Anesthesia Quick Evaluation

## 2018-02-06 NOTE — Op Note (Signed)
PREOP DIAGNOSIS: DJD RIGHT KNEE POSTOP DIAGNOSIS: same PROCEDURE: RIGHT TKR ANESTHESIA: Spinal and MAC ATTENDING SURGEON: Iban Utz Wiley ASSISTANT: Loni Dolly PA  INDICATIONS FOR PROCEDURE: Anthony Wiley is a 72 y.o. male who has struggled for a long time with pain due to degenerative arthritis of the right knee.  The patient has failed many conservative non-operative measures and at this point has pain which limits the ability to sleep and walk.  The patient is offered total knee replacement.  Informed operative consent was obtained after discussion of possible risks of anesthesia, infection, neurovascular injury, DVT, and death.  The importance of the post-operative rehabilitation protocol to optimize result was stressed extensively with the patient.  SUMMARY OF FINDINGS AND PROCEDURE:  Anthony Wiley was taken to the operative suite where under the above anesthesia a right knee replacement was performed.  There were advanced degenerative changes and the bone quality was excellent.  We used the DePuy LCS system and placed size standard plus femur, 5 tibia, 38 mm all polyethylene patella, and a size 12.5 mm spacer.  Loni Dolly PA-C assisted throughout and was invaluable to the completion of the case in that he helped retract and maintain exposure while I placed components.  He also helped close thereby minimizing OR time.  The patient was admitted for appropriate post-op care to include perioperative antibiotics and mechanical and pharmacologic measures for DVT prophylaxis.  DESCRIPTION OF PROCEDURE:  Anthony Wiley was taken to the operative suite where the above anesthesia was applied.  The patient was positioned supine and prepped and draped in normal sterile fashion.  An appropriate time out was performed.  After the administration of kefzol pre-op antibiotic the leg was elevated and exsanguinated and a tourniquet inflated. A standard longitudinal incision was made on the anterior knee.   Dissection was carried down to the extensor mechanism.  All appropriate anti-infective measures were used including the pre-operative antibiotic, betadine impregnated drape, and closed hooded exhaust systems for each member of the surgical team.  A medial parapatellar incision was made in the extensor mechanism and the knee cap flipped and the knee flexed.  Some residual meniscal tissues were removed along with any remaining ACL/PCL tissue.  A guide was placed on the tibia and a flat cut was made on it's superior surface.  An intramedullary guide was placed in the femur and was utilized to make anterior and posterior cuts creating an appropriate flexion gap.  A second intramedullary guide was placed in the femur to make a distal cut properly balancing the knee with an extension gap equal to the flexion gap.  The three bones sized to the above mentioned sizes and the appropriate guides were placed and utilized.  A trial reduction was done and the knee easily came to full extension and the patella tracked well on flexion.  The trial components were removed and all bones were cleaned with pulsatile lavage and then dried thoroughly.  Cement was mixed and was pressurized onto the bones followed by placement of the aforementioned components.  Excess cement was trimmed and pressure was held on the components until the cement had hardened.  The tourniquet was deflated and a small amount of bleeding was controlled with cautery and pressure.  The knee was irrigated thoroughly.  The extensor mechanism was re-approximated with V-loc suture in running fashion.  The knee was flexed and the repair was solid.  The subcutaneous tissues were re-approximated with #0 and #2-0 vicryl and the skin closed with  a subcuticular stitch and steristrips.  A sterile dressing was applied.  Intraoperative fluids, EBL, and tourniquet time can be obtained from anesthesia records.  DISPOSITION:  The patient was taken to recovery room in stable  condition and admitted for appropriate post-op care to include peri-operative antibiotic and DVT prophylaxis with mechanical and pharmacologic measures.  Anthony Wiley 02/06/2018, 2:27 PM

## 2018-02-06 NOTE — Anesthesia Postprocedure Evaluation (Signed)
Anesthesia Post Note  Patient: Anthony Wiley  Procedure(s) Performed: TOTAL KNEE ARTHROPLASTY (Right )     Patient location during evaluation: PACU Anesthesia Type: Spinal Level of consciousness: awake Pain management: pain level controlled Vital Signs Assessment: post-procedure vital signs reviewed and stable Respiratory status: spontaneous breathing Cardiovascular status: stable Postop Assessment: no headache, no backache, spinal receding, patient able to bend at knees and no apparent nausea or vomiting Anesthetic complications: no    Last Vitals:  Vitals:   02/06/18 1534 02/06/18 1543  BP: 112/61 105/68  Pulse: 66 64  Resp: 13 17  Temp:    SpO2: 99% 98%    Last Pain:  Vitals:   02/06/18 1525  TempSrc:   PainSc: 0-No pain   Pain Goal:                 Anthony Wiley,Anthony Wiley

## 2018-02-07 ENCOUNTER — Encounter (HOSPITAL_COMMUNITY): Payer: Self-pay | Admitting: Orthopaedic Surgery

## 2018-02-07 ENCOUNTER — Other Ambulatory Visit: Payer: Self-pay

## 2018-02-07 NOTE — Progress Notes (Signed)
Pt IV infiltratrated and swelled up RN stopped infusion and removed IV. Placed warm pac over it help decrease swelling will continue to monitor

## 2018-02-07 NOTE — Care Plan (Signed)
Met with patient at bedside. Anticipating discharge to home when medically stable and ready from PT. Rolling walker ordered from [place] to be delivered to room prior to discharge to home. He will be see by HHPT provided by [facility]. He is scheduled to follow up in the office with Dr. Rhona Raider on 02/16/18 @ 945 and transition to [place] at [address] at 1120.   Please contact Ladell Heads, [place] with questions or if this plan should need to change.   Thank you

## 2018-02-07 NOTE — Progress Notes (Signed)
Subjective: 1 Day Post-Op Procedure(s) (LRB): TOTAL KNEE ARTHROPLASTY (Right)  Activity level:  wbat Diet tolerance:  ok Voiding:  ok Patient reports pain as mild and moderate.    Objective: Vital signs in last 24 hours: Temp:  [97.5 F (36.4 C)-98.6 F (37 C)] 97.8 F (36.6 C) (05/08 0540) Pulse Rate:  [64-91] 75 (05/08 0540) Resp:  [13-20] 20 (05/07 1753) BP: (105-169)/(47-90) 137/78 (05/08 0540) SpO2:  [93 %-99 %] 97 % (05/08 0540)  Labs: No results for input(s): HGB in the last 72 hours. No results for input(s): WBC, RBC, HCT, PLT in the last 72 hours. No results for input(s): NA, K, CL, CO2, BUN, CREATININE, GLUCOSE, CALCIUM in the last 72 hours. No results for input(s): LABPT, INR in the last 72 hours.  Physical Exam:  Neurologically intact ABD soft Neurovascular intact Sensation intact distally Intact pulses distally Dorsiflexion/Plantar flexion intact Incision: dressing C/D/I and no drainage No cellulitis present Compartment soft  Assessment/Plan:  1 Day Post-Op Procedure(s) (LRB): TOTAL KNEE ARTHROPLASTY (Right) Advance diet Up with therapy Discharge home with home health either tomorrow or Friday depending on how patient does with PT. Continue on ASA 325mg  BID x 2 weeks post op. Follow up in office 2 weeks post op Anticipated LOS equal to or greater than 2 midnights due to - Age 70 and older with one or more of the following:  - Obesity  - Expected need for hospital services (PT, OT, Nursing) required for safe  discharge  - Anticipated need for postoperative skilled nursing care or inpatient rehab  - Active co-morbidities: None OR   - Unanticipated findings during/Post Surgery: Slow post-op progression: GI, pain control, mobility  - Patient is a high risk of re-admission due to: Barriers to post-acute care (logistical, no family support in home)   Anthony Wiley, Larwance Sachs 02/07/2018, 7:33 AM

## 2018-02-07 NOTE — Evaluation (Signed)
Physical Therapy Evaluation Patient Details Name: Anthony Wiley MRN: [id number] DOB: 1946-09-11 Today's Date: 02/07/2018   History of Present Illness  Patient is a 72 y.o. M with significant PMH of previous back surgery and L TKA, who is now s/p right total knee arthroplasty.  Clinical Impression  Pt is s/p TKA resulting in the deficits listed below (see PT Problem List). Patient lives alone but states brother is available to assist as needed. Able to ambulate 80 feet with RW this session utilizing a step to pattern. Has poor proximity and adherence to using RW correctly so needs reinforcement. Will follow to progress gait training, stair training, strengthening, and range of motion.  Pt will benefit from skilled PT to increase their independence and safety with mobility to allow discharge to the venue listed below.      Follow Up Recommendations Follow surgeon's recommendation for DC plan and follow-up therapies    Equipment Recommendations  Rolling walker with 5" wheels    Recommendations for Other Services       Precautions / Restrictions Precautions Precautions: Fall;Knee Precaution Booklet Issued: Yes (comment) Precaution Comments: Reviewed no pillow underneath knee Required Braces or Orthoses: Knee Immobilizer - Right Knee Immobilizer - Right: Other (comment)(Pt able to perform SLR - did not use for ambulation) Restrictions Weight Bearing Restrictions: Yes RLE Weight Bearing: Weight bearing as tolerated      Mobility  Bed Mobility Overal bed mobility: Modified Independent                Transfers Overall transfer level: Needs assistance Equipment used: Rolling walker (2 wheeled) Transfers: Sit to/from Stand Sit to Stand: Supervision General Transfer Details: VC's to turn RW fully prior to sitting            Ambulation/Gait Ambulation/Gait assistance: Min guard Ambulation Distance (Feet): 80 Feet Assistive device: Rolling walker (2 wheeled) Gait  Pattern/deviations: Step-to pattern;Antalgic     General Gait Details: VC's for RW proximity and keeping walker on the ground versus picking up with every step.   Stairs            Wheelchair Mobility    Modified Rankin (Stroke Patients Only)       Balance Overall balance assessment: Mild deficits observed, not formally tested                                           Pertinent Vitals/Pain Pain Assessment: 0-10 Pain Score: 7  Pain Location: R knee Pain Descriptors / Indicators: Guarding;Throbbing    Home Living Family/patient expects to be discharged to:: Private residence Living Arrangements: Alone Available Help at Discharge: Family Type of Home: Mobile home Home Access: Stairs to enter Entrance Stairs-Rails: Can reach both Entrance Stairs-Number of Steps: 3 Home Layout: One level Home Equipment: Cane - single point;Walker - 2 wheels      Prior Function Level of Independence: Independent with assistive device(s)         Comments: Used RW for ambulation.      Hand Dominance        Extremity/Trunk Assessment   Upper Extremity Assessment Upper Extremity Assessment: Overall WFL for tasks assessed    Lower Extremity Assessment Lower Extremity Assessment: RLE deficits/detail;LLE deficits/detail RLE Deficits / Details: MMT: hip flexion 3/5, knee extension 4/5, ankle dorsiflexion/plantarflexion 5/5 LLE Deficits / Details: WFL       Communication   Communication:  No difficulties  Cognition Arousal/Alertness: Awake/alert Behavior During Therapy: WFL for tasks assessed/performed Overall Cognitive Status: Within Functional Limits for tasks assessed                                        General Comments      Exercises Total Joint Exercises Ankle Circles/Pumps: 20 reps;Both;Seated Quad Sets: 10 reps;Both;Seated Long Arc Quad: 5 reps;Right;Seated   Assessment/Plan    PT Assessment Patient needs continued PT  services  PT Problem List Decreased strength;Decreased range of motion;Decreased activity tolerance;Decreased balance;Decreased mobility;Decreased knowledge of use of DME;Decreased knowledge of precautions;Pain       PT Treatment Interventions DME instruction;Gait training;Stair training;Functional mobility training;Therapeutic activities;Therapeutic exercise;Balance training;Patient/family education    PT Goals (Current goals can be found in the Care Plan section)  Acute Rehab PT Goals Patient Stated Goal: decrease pain PT Goal Formulation: With patient Time For Goal Achievement: 02/12/18 Potential to Achieve Goals: Good    Frequency 7X/week   Barriers to discharge        Co-evaluation               AM-PAC PT "6 Clicks" Daily Activity  Outcome Measure Difficulty turning over in bed (including adjusting bedclothes, sheets and blankets)?: A Little Difficulty moving from lying on back to sitting on the side of the bed? : A Little Difficulty sitting down on and standing up from a chair with arms (e.g., wheelchair, bedside commode, etc,.)?: A Little Help needed moving to and from a bed to chair (including a wheelchair)?: A Little Help needed walking in hospital room?: A Little Help needed climbing 3-5 steps with a railing? : A Little 6 Click Score: 18    End of Session Equipment Utilized During Treatment: Gait belt Activity Tolerance: Patient tolerated treatment well Patient left: in chair;with call bell/phone within reach;with family/visitor present Nurse Communication: Mobility status PT Visit Diagnosis: Difficulty in walking, not elsewhere classified (R26.2);Pain Pain - Right/Left: Right Pain - part of body: Knee    Time: 6606-0045 PT Time Calculation (min) (ACUTE ONLY): 32 min   Charges:   PT Evaluation $PT Eval Low Complexity: 1 Low PT Treatments $Gait Training: 8-22 mins   PT G Codes:        Anthony Wiley, PT, DPT Acute Rehabilitation Services  Pager:  [phone]   Anthony Wiley 02/07/2018, 1:14 PM

## 2018-02-07 NOTE — Progress Notes (Signed)
PT Cancellation Note  Patient Details Name: Anthony Wiley MRN: [id number] DOB: Jul 28, 1946   Cancelled Treatment:    Reason Eval/Treat Not Completed: Pain limiting ability to participate. Patient declined additional therapy this afternoon secondary to pain (received pain medication 10 minutes prior). Will continue to see tomorrow.  Ellamae Sia, PT, DPT Acute Rehabilitation Services  Pager: [place] 02/07/2018, 5:15 PM

## 2018-02-08 NOTE — Progress Notes (Signed)
Physical Therapy Treatment Patient Details Name: Anthony Wiley MRN: [id number] DOB: 1946-02-15 Today's Date: 02/08/2018    History of Present Illness Patient is a 72 y.o. M with significant PMH of previous back surgery and L TKA, who is now s/p right total knee arthroplasty.    PT Comments    Pt performed increased gait with max VCs for encouragement. Pt required cues for upper trunk control, safety and to avoid reaching for objects in room and halls for support and focus on using his RW correctly for support.  Pt placed in CPM 0-75 degrees.  Plan for stair training next session.  Pt remains limited due to pain and self limiting behavior.    Follow Up Recommendations  Follow surgeon's recommendation for DC plan and follow-up therapies     Equipment Recommendations  Rolling walker with 5" wheels    Recommendations for Other Services       Precautions / Restrictions Precautions Precautions: Fall;Knee Precaution Comments: Reviewed no pillow underneath knee Required Braces or Orthoses: Knee Immobilizer - Right Knee Immobilizer - Right: Other (comment) Restrictions Weight Bearing Restrictions: Yes RLE Weight Bearing: Weight bearing as tolerated    Mobility  Bed Mobility Overal bed mobility: Needs Assistance Bed Mobility: Sit to Supine     Supine to sit: Min assist Sit to supine: Min assist   General bed mobility comments: Cues for lifting RLE with LLE, assistance to position in bed.    Transfers Overall transfer level: Needs assistance Equipment used: Rolling walker (2 wheeled) Transfers: Sit to/from Stand Sit to Stand: Min guard         General transfer comment: Cues for hand placement to and from seated surface.   Ambulation/Gait Ambulation/Gait assistance: Mod assist Ambulation Distance (Feet): 80 Feet Assistive device: Rolling walker (2 wheeled) Gait Pattern/deviations: Step-to pattern;Antalgic;Trunk flexed     General Gait Details: VCs for RW proximity  and UE in R stance phase.  Pt required significant increase in assistance due to pain.  Pt reaching for bed rails and rails in halls, educated this was not safe.  Pt performed with x3 rest breaks. during last 8 feet he flexed forward on RW.  Pt required multiple cues for safety.     Stairs             Wheelchair Mobility    Modified Rankin (Stroke Patients Only)       Balance Overall balance assessment: Needs assistance   Sitting balance-Leahy Scale: Fair       Standing balance-Leahy Scale: Poor                              Cognition Arousal/Alertness: Awake/alert Behavior During Therapy: WFL for tasks assessed/performed Overall Cognitive Status: Within Functional Limits for tasks assessed                                        Exercises Total Joint Exercises Ankle Circles/Pumps: 20 reps;Both;Seated Quad Sets: 10 reps;Seated;Right Heel Slides: AAROM;Right;10 reps;Supine Hip ABduction/ADduction: Right;10 reps;Supine;AAROM Goniometric ROM: 70 degrees flexion in R knee.      General Comments        Pertinent Vitals/Pain Pain Assessment: 0-10 Pain Score: 8  Pain Location: R knee Pain Descriptors / Indicators: Guarding;Throbbing Pain Intervention(s): Monitored during session;Repositioned;Ice applied    Home Living  Prior Function            PT Goals (current goals can now be found in the care plan section) Acute Rehab PT Goals Patient Stated Goal: decrease pain Potential to Achieve Goals: Good Progress towards PT goals: Progressing toward goals    Frequency    7X/week      PT Plan Current plan remains appropriate    Co-evaluation              AM-PAC PT "6 Clicks" Daily Activity  Outcome Measure  Difficulty turning over in bed (including adjusting bedclothes, sheets and blankets)?: Unable Difficulty moving from lying on back to sitting on the side of the bed? : Unable Difficulty  sitting down on and standing up from a chair with arms (e.g., wheelchair, bedside commode, etc,.)?: Unable Help needed moving to and from a bed to chair (including a wheelchair)?: A Little Help needed walking in hospital room?: A Lot Help needed climbing 3-5 steps with a railing? : A Lot 6 Click Score: 10    End of Session Equipment Utilized During Treatment: Gait belt Activity Tolerance: Patient tolerated treatment well Patient left: with call bell/phone within reach;with family/visitor present;in bed Nurse Communication: Mobility status PT Visit Diagnosis: Difficulty in walking, not elsewhere classified (R26.2);Pain Pain - Right/Left: Right Pain - part of body: Knee     Time: 1421-1455 PT Time Calculation (min) (ACUTE ONLY): 34 min  Charges:  $Gait Training: 8-22 mins $Therapeutic Activity: 8-22 mins                    G Codes:       Governor Rooks, PTA pager [phone]    Cristela Blue 02/08/2018, 3:01 PM

## 2018-02-08 NOTE — Progress Notes (Addendum)
Subjective: 2 Days Post-Op Procedure(s) (LRB): TOTAL KNEE ARTHROPLASTY (Right)   Patient is having an increase in pain this morning. He states the medicine does help but it is still quite painful.  Activity level:  wbat Diet tolerance:  ok Voiding:  ok Patient reports pain as moderate.    Objective: Vital signs in last 24 hours: Temp:  [97.5 F (36.4 C)-97.9 F (36.6 C)] 97.9 F (36.6 C) (05/08 2145) Pulse Rate:  [69-74] 74 (05/08 2145) Resp:  [16] 16 (05/08 2145) BP: (150)/(77-80) 150/80 (05/08 2145) SpO2:  [99 %-100 %] 100 % (05/08 2145)  Labs: No results for input(s): HGB in the last 72 hours. No results for input(s): WBC, RBC, HCT, PLT in the last 72 hours. No results for input(s): NA, K, CL, CO2, BUN, CREATININE, GLUCOSE, CALCIUM in the last 72 hours. No results for input(s): LABPT, INR in the last 72 hours.  Physical Exam:  Neurologically intact ABD soft Neurovascular intact Sensation intact distally Intact pulses distally Dorsiflexion/Plantar flexion intact Incision: dressing C/D/I and no drainage No cellulitis present Compartment soft  Assessment/Plan:  2 Days Post-Op Procedure(s) (LRB): TOTAL KNEE ARTHROPLASTY (Right) Advance diet Up with therapy Plan for discharge tomorrow Discharge home with home health if doing well and cleared by PT.  Conitnue on ASA 325mg  BID. Follow up in office 2 weeks post op. Anticipated LOS equal to or greater than 2 midnights due to - Age 73 and older with one or more of the following:  - Obesity  - Expected need for hospital services (PT, OT, Nursing) required for safe  discharge  - Anticipated need for postoperative skilled nursing care or inpatient rehab  - Active co-morbidities: None OR   - Unanticipated findings during/Post Surgery: Slow post-op progression: GI, pain control, mobility  - Patient is a high risk of re-admission due to: Barriers to post-acute care (logistical, no family support in home)  Kye Hedden, Larwance Sachs 02/08/2018, 6:57 AM

## 2018-02-08 NOTE — Progress Notes (Addendum)
Physical Therapy Treatment Patient Details Name: Anthony Wiley MRN: [id number] DOB: 04-18-1946 Today's Date: 02/08/2018    History of Present Illness Patient is a 72 y.o. M with significant PMH of previous back surgery and L TKA, who is now s/p right total knee arthroplasty.    PT Comments    Pt performed gait and functional mobility but limited and required increased assistance due to pain.  Pt required cues for upper trunk control, use of UE to off set weight bearing due to pain.  Plan next session to progress gait training to tolerance.  Pt is at risk for falls based on presentation today.    Follow Up Recommendations  Follow surgeon's recommendation for DC plan and follow-up therapies     Equipment Recommendations  Rolling walker with 5" wheels    Recommendations for Other Services       Precautions / Restrictions Precautions Precautions: Fall;Knee Precaution Comments: Reviewed no pillow underneath knee Required Braces or Orthoses: Knee Immobilizer - Right Knee Immobilizer - Right: Other (comment) Restrictions Weight Bearing Restrictions: Yes RLE Weight Bearing: Weight bearing as tolerated    Mobility  Bed Mobility Overal bed mobility: Needs Assistance Bed Mobility: Supine to Sit     Supine to sit: Min assist     General bed mobility comments: Cues for hand placement, assistance with RLE to edge of bed and to elevate trunk into sitting.    Transfers Overall transfer level: Needs assistance Equipment used: Rolling walker (2 wheeled) Transfers: Sit to/from Stand Sit to Stand: Min assist         General transfer comment: Cues for hand placement to and from seated surface.  Pt required assistance to boost into standing secondary to pain.    Ambulation/Gait Ambulation/Gait assistance: Mod assist Ambulation Distance (Feet): 12 Feet Assistive device: Rolling walker (2 wheeled) Gait Pattern/deviations: Step-to pattern;Antalgic;Trunk flexed     General Gait  Details: VCs for RW proximity and UE in R stance phase.  Pt required significant increase in assistance due to pain.     Stairs             Wheelchair Mobility    Modified Rankin (Stroke Patients Only)       Balance Overall balance assessment: Mild deficits observed, not formally tested                                          Cognition Arousal/Alertness: Awake/alert Behavior During Therapy: WFL for tasks assessed/performed Overall Cognitive Status: Within Functional Limits for tasks assessed                                        Exercises Total Joint Exercises Ankle Circles/Pumps: 20 reps;Both;Seated Quad Sets: 10 reps;Seated;Right Heel Slides: AAROM;Right;10 reps;Supine Hip ABduction/ADduction: Right;10 reps;Supine;AAROM    General Comments        Pertinent Vitals/Pain Pain Assessment: 0-10 Pain Score: 10-Worst pain ever Pain Location: R knee Pain Descriptors / Indicators: Guarding;Throbbing Pain Intervention(s): Monitored during session;Repositioned;Ice applied    Home Living                      Prior Function            PT Goals (current goals can now be found in the care plan section) Acute  Rehab PT Goals Patient Stated Goal: decrease pain Potential to Achieve Goals: Good Progress towards PT goals: Progressing toward goals    Frequency    7X/week      PT Plan Current plan remains appropriate    Co-evaluation              AM-PAC PT "6 Clicks" Daily Activity  Outcome Measure  Difficulty turning over in bed (including adjusting bedclothes, sheets and blankets)?: Unable Difficulty moving from lying on back to sitting on the side of the bed? : Unable Difficulty sitting down on and standing up from a chair with arms (e.g., wheelchair, bedside commode, etc,.)?: Unable Help needed moving to and from a bed to chair (including a wheelchair)?: A Lot Help needed walking in hospital room?: A  Lot Help needed climbing 3-5 steps with a railing? : A Lot 6 Click Score: 9    End of Session Equipment Utilized During Treatment: Gait belt Activity Tolerance: Patient tolerated treatment well Patient left: in chair;with call bell/phone within reach;with family/visitor present Nurse Communication: Mobility status PT Visit Diagnosis: Difficulty in walking, not elsewhere classified (R26.2);Pain Pain - Right/Left: Right Pain - part of body: Knee     Time: 1211-1230 PT Time Calculation (min) (ACUTE ONLY): 19 min  Charges:  $Therapeutic Activity: 8-22 mins                    G Codes:       Governor Rooks, PTA pager [phone]    Cristela Blue 02/08/2018, 2:00 PM

## 2018-02-08 NOTE — Progress Notes (Signed)
Orthopedic Tech Progress Note Patient Details:  Anthony Wiley 07-Sep-1946 [id number]  CPM Right Knee CPM Right Knee: On Right Knee Flexion (Degrees): 90 Right Knee Extension (Degrees): 0 Additional Comments: CPM started at 10am  Post Interventions Patient Tolerated: Fair Instructions Provided: Care of device, Adjustment of device  [name] 02/08/2018, 10:59 AM

## 2018-02-08 NOTE — Plan of Care (Signed)
  Problem: Nutrition: Goal: Adequate nutrition will be maintained Outcome: Progressing   Problem: Elimination: Goal: Will not experience complications related to bowel motility Outcome: Progressing   Problem: Safety: Goal: Ability to remain free from injury will improve Outcome: Progressing   Problem: Skin Integrity: Goal: Risk for impaired skin integrity will decrease Outcome: Progressing   

## 2018-02-08 NOTE — Care Management Note (Signed)
Case Management Note  Patient Details  Name: Anthony Wiley MRN: [id number] Date of Birth: 17-May-1946  Subjective/Objective:    72 yr old gentleman s/p right total knee arthroplasty.                Action/Plan: Patient was preoperatively setup with [facility], no changes.    Expected Discharge Date:   02/09/18               Expected Discharge Plan:  [facility]  In-House Referral:  NA  Discharge planning Services  CM Consult  Post Acute Care Choice:  Durable Medical Equipment, Home Health Choice offered to:  Patient  DME Arranged:  Walker rolling, CPM DME Agency:  [employer]  HH Arranged:  PT HH Agency:  [facility] [employer]  Status of Service:  Completed, signed off  If discussed at [employer], dates discussed:    Additional Comments:  Ninfa Meeker, RN 02/08/2018, 11:24 AM

## 2018-02-09 MED ORDER — TIZANIDINE HCL 4 MG PO TABS: 4.0000 mg | ORAL_TABLET | Freq: Four times a day (QID) | ORAL | 1 refills | Status: AC | PRN

## 2018-02-09 MED ORDER — DOCUSATE SODIUM 100 MG PO CAPS: 100.0000 mg | ORAL_CAPSULE | Freq: Two times a day (BID) | ORAL | 0 refills | Status: AC

## 2018-02-09 MED ORDER — ASPIRIN 325 MG PO TBEC: 325.0000 mg | DELAYED_RELEASE_TABLET | Freq: Two times a day (BID) | ORAL | 0 refills | Status: AC

## 2018-02-09 MED ORDER — OXYCODONE-ACETAMINOPHEN 7.5-325 MG PO TABS: 1.0000 | ORAL_TABLET | ORAL | 0 refills | Status: DC | PRN

## 2018-02-09 MED ORDER — BISACODYL 5 MG PO TBEC: 5.0000 mg | DELAYED_RELEASE_TABLET | Freq: Every day | ORAL | 0 refills | Status: AC | PRN

## 2018-02-09 NOTE — Plan of Care (Signed)
  Problem: Health Behavior/Discharge Planning: Goal: Ability to manage health-related needs will improve Outcome: Progressing   

## 2018-02-09 NOTE — Progress Notes (Signed)
Subjective: 3 Days Post-Op Procedure(s) (LRB): TOTAL KNEE ARTHROPLASTY (Right)   Patient still in a good bit of pain. He did walk 80 feet with PT yesterday. HE states that both He and his brother would feel better if he stayed one more night and goes home tomorrow afternoon.   Activity level:  wbat Diet tolerance:  ok Voiding:  ok Patient reports pain as moderate.    Objective: Vital signs in last 24 hours: Temp:  [98.6 F (37 C)-98.8 F (37.1 C)] 98.7 F (37.1 C) (05/10 0610) Pulse Rate:  [76-85] 85 (05/10 0610) Resp:  [17-18] 18 (05/10 0610) BP: (127-137)/(60-72) 127/60 (05/10 0610) SpO2:  [94 %-95 %] 94 % (05/10 0610)  Labs: No results for input(s): HGB in the last 72 hours. No results for input(s): WBC, RBC, HCT, PLT in the last 72 hours. No results for input(s): NA, K, CL, CO2, BUN, CREATININE, GLUCOSE, CALCIUM in the last 72 hours. No results for input(s): LABPT, INR in the last 72 hours.  Physical Exam:  Neurologically intact ABD soft Neurovascular intact Sensation intact distally Intact pulses distally Dorsiflexion/Plantar flexion intact Incision: dressing C/D/I and no drainage No cellulitis present Compartment soft  Assessment/Plan:  3 Days Post-Op Procedure(s) (LRB): TOTAL KNEE ARTHROPLASTY (Right) Advance diet Up with therapy Plan for discharge tomorrow Discharge home with home health if doing well and cleared by PT. Continue on ASA 325mg  BID x 2 weeks post op. Follow up in office 2 weeks post op. Anticipated LOS equal to or greater than 2 midnights due to - Age 55 and older with one or more of the following:  - Obesity  - Expected need for hospital services (PT, OT, Nursing) required for safe  discharge  - Anticipated need for postoperative skilled nursing care or inpatient rehab  - Active co-morbidities: Chronic pain requiring opiods OR   - Unanticipated findings during/Post Surgery: Slow post-op progression: GI, pain control, mobility  - Patient  is a high risk of re-admission due to: Barriers to post-acute care (logistical, no family support in home)  Oracio Galen, Larwance Sachs 02/09/2018, 7:35 AM

## 2018-02-09 NOTE — Progress Notes (Signed)
Physical Therapy Treatment Patient Details Name: Anthony Wiley MRN: [id number] DOB: Feb 09, 1946 Today's Date: 02/09/2018    History of Present Illness Patient is a 72 y.o. M with significant PMH of previous back surgery and L TKA, who is now s/p right total knee arthroplasty.    PT Comments    Pt performed gait training and functional mobility during session.  Pt required decreased assistance during session but remains to present with poor safety. Plan next session for stair training if patient is able to tolerate.  Upon standing.  Pt noted to have urinary incontinence.  Pt placed in bathroom for sponge bath with NT present.     Follow Up Recommendations  Follow surgeon's recommendation for DC plan and follow-up therapies     Equipment Recommendations  Rolling walker with 5" wheels    Recommendations for Other Services       Precautions / Restrictions Precautions Precautions: Fall;Knee Precaution Booklet Issued: Yes (comment) Precaution Comments: Reviewed no pillow underneath knee Required Braces or Orthoses: Knee Immobilizer - Right Knee Immobilizer - Right: Other (comment) Restrictions Weight Bearing Restrictions: Yes RLE Weight Bearing: Weight bearing as tolerated    Mobility  Bed Mobility Overal bed mobility: Needs Assistance Bed Mobility: Supine to Sit     Supine to sit: Min assist     General bed mobility comments: Pt required assistance for   Transfers Overall transfer level: Needs assistance Equipment used: Rolling walker (2 wheeled) Transfers: Sit to/from Stand Sit to Stand: Min guard         General transfer comment: Cues for hand placement to and from seated surface. Pt  pulling on RW to achieve standing.    Ambulation/Gait Ambulation/Gait assistance: Min assist Ambulation Distance (Feet): 100 Feet Assistive device: Rolling walker (2 wheeled) Gait Pattern/deviations: Step-to pattern;Antalgic;Trunk flexed   Gait velocity interpretation: <1.8  ft/sec, indicate of risk for recurrent falls General Gait Details: VCs for RW proximity. Pt remains to reach for foot board in room for support.  Pt continually educated that this is not safe.     Stairs             Wheelchair Mobility    Modified Rankin (Stroke Patients Only)       Balance Overall balance assessment: Needs assistance   Sitting balance-Leahy Scale: Fair       Standing balance-Leahy Scale: Poor                              Cognition Arousal/Alertness: Awake/alert Behavior During Therapy: WFL for tasks assessed/performed Overall Cognitive Status: Within Functional Limits for tasks assessed                                        Exercises Total Joint Exercises Ankle Circles/Pumps: 20 reps;Both;Seated Quad Sets: 10 reps;Seated;Right Heel Slides: AAROM;Right;10 reps;Supine Hip ABduction/ADduction: AAROM;Right;Supine;10 reps Straight Leg Raises: AAROM;Right;Supine;10 reps Goniometric ROM: 74 degrees flexion in R knee.      General Comments        Pertinent Vitals/Pain Pain Assessment: 0-10 Pain Score: 8  Pain Location: R knee Pain Descriptors / Indicators: Guarding;Throbbing Pain Intervention(s): Monitored during session;Repositioned    Home Living                      Prior Function  PT Goals (current goals can now be found in the care plan section) Acute Rehab PT Goals Patient Stated Goal: decrease pain Potential to Achieve Goals: Good Progress towards PT goals: Progressing toward goals    Frequency    7X/week      PT Plan Current plan remains appropriate    Co-evaluation              AM-PAC PT "6 Clicks" Daily Activity  Outcome Measure  Difficulty turning over in bed (including adjusting bedclothes, sheets and blankets)?: Unable Difficulty moving from lying on back to sitting on the side of the bed? : Unable Difficulty sitting down on and standing up from a chair  with arms (e.g., wheelchair, bedside commode, etc,.)?: Unable Help needed moving to and from a bed to chair (including a wheelchair)?: A Little Help needed walking in hospital room?: A Little Help needed climbing 3-5 steps with a railing? : A Lot 6 Click Score: 11    End of Session Equipment Utilized During Treatment: Gait belt Activity Tolerance: Patient tolerated treatment well Patient left: with call bell/phone within reach;with family/visitor present;in bed Nurse Communication: Mobility status PT Visit Diagnosis: Difficulty in walking, not elsewhere classified (R26.2);Pain Pain - Right/Left: Right Pain - part of body: Knee     Time: 4818-5631 PT Time Calculation (min) (ACUTE ONLY): 26 min  Charges:  $Gait Training: 8-22 mins $Therapeutic Exercise: 8-22 mins                    G Codes:       Governor Rooks, PTA pager [phone]    Cristela Blue 02/09/2018, 10:42 AM

## 2018-02-09 NOTE — Discharge Instructions (Signed)

## 2018-02-09 NOTE — Progress Notes (Signed)
Physical Therapy Treatment Patient Details Name: Anthony Wiley MRN: [id number] DOB: [birth year] Today's Date: 02/09/2018    History of Present Illness Patient is a 72 y.o. M with significant PMH of previous back surgery and L TKA, who is now s/p right total knee arthroplasty.    PT Comments    Pt appears to be in decreased pain.  Pt opted for stair training tomorrow as he reports," It will be my graduation present." Plans for d/c home tomorrow.  Continue PT with plan for stair training to ensure safe entry into home.    Follow Up Recommendations  Follow surgeon's recommendation for DC plan and follow-up therapies     Equipment Recommendations  Rolling walker with 5" wheels    Recommendations for Other Services       Precautions / Restrictions Precautions Precautions: Fall;Knee Precaution Booklet Issued: Yes (comment) Precaution Comments: Reviewed no pillow underneath knee Required Braces or Orthoses: Knee Immobilizer - Right Knee Immobilizer - Right: Other (comment) Restrictions Weight Bearing Restrictions: Yes RLE Weight Bearing: Weight bearing as tolerated    Mobility  Bed Mobility               General bed mobility comments: Pt in recliner on arrival.    Transfers Overall transfer level: Needs assistance Equipment used: Rolling walker (2 wheeled) Transfers: Sit to/from Stand Sit to Stand: Min guard         General transfer comment: Cues for hand placement to and from seated surface. Pt with better hand placement transferring from the recliner.    Ambulation/Gait Ambulation/Gait assistance: Min assist Ambulation Distance (Feet): 120 Feet Assistive device: Rolling walker (2 wheeled) Gait Pattern/deviations: Step-to pattern;Antalgic;Trunk flexed;Step-through pattern   Gait velocity interpretation: <1.31 ft/sec, indicative of household ambulator General Gait Details: Pt able to progress to step through pattern.  Remains to require cues for safe  sequencing into RW.  Pt remains with poor safety and requires cues to keep hands on RW.     Stairs             Wheelchair Mobility    Modified Rankin (Stroke Patients Only)       Balance Overall balance assessment: Needs assistance   Sitting balance-Leahy Scale: Fair       Standing balance-Leahy Scale: Poor                              Cognition Arousal/Alertness: Awake/alert Behavior During Therapy: WFL for tasks assessed/performed Overall Cognitive Status: Within Functional Limits for tasks assessed                                        Exercises Total Joint Exercises Ankle Circles/Pumps: 20 reps;Both;Seated Quad Sets: 10 reps;Seated;Right Heel Slides: AAROM;Right;10 reps;Supine Hip ABduction/ADduction: AAROM;Right;Supine;10 reps Straight Leg Raises: AAROM;Right;Supine;10 reps Goniometric ROM: 74 degrees flexion in R knee.      General Comments        Pertinent Vitals/Pain Pain Assessment: 0-10 Pain Score: 5  Pain Location: R knee Pain Descriptors / Indicators: Guarding;Throbbing Pain Intervention(s): Monitored during session;Repositioned;Ice applied    Home Living                      Prior Function            PT Goals (current goals can now be found in the care  plan section) Acute Rehab PT Goals Patient Stated Goal: decrease pain Potential to Achieve Goals: Good Progress towards PT goals: Progressing toward goals    Frequency    7X/week      PT Plan Current plan remains appropriate    Co-evaluation              AM-PAC PT "6 Clicks" Daily Activity  Outcome Measure  Difficulty turning over in bed (including adjusting bedclothes, sheets and blankets)?: Unable Difficulty moving from lying on back to sitting on the side of the bed? : Unable Difficulty sitting down on and standing up from a chair with arms (e.g., wheelchair, bedside commode, etc,.)?: Unable Help needed moving to and from a  bed to chair (including a wheelchair)?: A Little Help needed walking in hospital room?: A Little Help needed climbing 3-5 steps with a railing? : A Little 6 Click Score: 12    End of Session Equipment Utilized During Treatment: Gait belt Activity Tolerance: Patient tolerated treatment well Patient left: with call bell/phone within reach;with family/visitor present;in bed Nurse Communication: Mobility status PT Visit Diagnosis: Difficulty in walking, not elsewhere classified (R26.2);Pain Pain - Right/Left: Right Pain - part of body: Knee     Time: 9528-4132 PT Time Calculation (min) (ACUTE ONLY): 18 min  Charges:  $Gait Training: 8-22 mins                    G Codes:       Governor Rooks, PTA pager [phone]    Cristela Blue 02/09/2018, 2:36 PM

## 2018-02-10 MED ORDER — OXYCODONE-ACETAMINOPHEN 7.5-325 MG PO TABS: 1.0000 | ORAL_TABLET | ORAL | 0 refills | Status: AC | PRN

## 2018-02-10 NOTE — Progress Notes (Signed)
Physical Therapy Treatment Patient Details Name: Anthony Wiley MRN: [id number] DOB: Jul 09, 1946 Today's Date: 02/10/2018    History of Present Illness Patient is a 72 y.o. M with significant PMH of previous back surgery and L TKA, who is now s/p right total knee arthroplasty.    PT Comments    Today's skilled session continued to address mobility post knee replacement. Pt with increased gait distance and overall less assistance needed with all mobility. Stair instruction completed this session. Patient safe to D/C from a mobility standpoint based on progression towards goals set on PT eval.     Follow Up Recommendations  Follow surgeon's recommendation for DC plan and follow-up therapies     Equipment Recommendations  Rolling walker with 5" wheels    Precautions / Restrictions Precautions Precautions: Fall;Knee Precaution Comments: Reviewed no pillow underneath knee Required Braces or Orthoses: Knee Immobilizer - Right Knee Immobilizer - Right: Other (comment)(use not specified in MD orders) Restrictions Weight Bearing Restrictions: Yes RLE Weight Bearing: Weight bearing as tolerated    Mobility  Bed Mobility Overal bed mobility: Modified Independent Bed Mobility: Supine to Sit     Supine to sit: Modified independent (Device/Increase time)     General bed mobility comments: bed flat. pt shown to use belt (sheet at home) to hook right foot and advance it to edge/off edge of bed. increased time needed, no physical assistance was needed.   Transfers Overall transfer level: Needs assistance Equipment used: Rolling walker (2 wheeled) Transfers: Sit to/from Stand Sit to Stand: Supervision         General transfer comment: x2 reps during session with pt demo'ing safe hand and right LE placement. good weight shifitng noted as well.   Ambulation/Gait Ambulation/Gait assistance: Min guard;Supervision Ambulation Distance (Feet): 250 Feet Assistive device: Rolling walker  (2 wheeled) Gait Pattern/deviations: Step-through pattern;Decreased stride length;Decreased step length - left;Decreased stance time - right;Antalgic;Trunk flexed Gait velocity: decreased Gait velocity interpretation: <1.31 ft/sec, indicative of household ambulator General Gait Details: pt with heavy reliance on RW during gait. cues for upright posture and for equal step length/stance time with gait to improve fluidity of gait pattern.    Stairs Stairs: Yes Stairs assistance: Min guard Stair Management: Two rails;Step to pattern;Forwards Number of Stairs: 3 General stair comments: PTA demo'd technique/sequencing prior to pt performance. min gaurd assistance needed with pt performance with pt remembering correct sequence without cues.        Cognition Arousal/Alertness: Awake/alert Behavior During Therapy: WFL for tasks assessed/performed Overall Cognitive Status: Within Functional Limits for tasks assessed        Exercises Total Joint Exercises Ankle Circles/Pumps: AROM;Strengthening;Both;5 reps;Supine Quad Sets: AROM;Strengthening;Right;10 reps;Supine Heel Slides: AAROM;Strengthening;Right;10 reps;Supine(min AA needed) Straight Leg Raises: AROM;Strengthening;Right;10 reps;Supine     Pertinent Vitals/Pain Pain Assessment: 0-10 Pain Score: 5  Pain Location: R knee Pain Descriptors / Indicators: Aching;Sore;Operative site guarding Pain Intervention(s): Limited activity within patient's tolerance;Monitored during session;Premedicated before session;Repositioned     PT Goals (current goals can now be found in the care plan section) Acute Rehab PT Goals Patient Stated Goal: decrease pain PT Goal Formulation: With patient Time For Goal Achievement: 02/12/18 Potential to Achieve Goals: Good Progress towards PT goals: Progressing toward goals    Frequency    7X/week      PT Plan Current plan remains appropriate    AM-PAC PT "6 Clicks" Daily Activity  Outcome  Measure  Difficulty turning over in bed (including adjusting bedclothes, sheets and blankets)?: A Little Difficulty moving  from lying on back to sitting on the side of the bed? : A Little Difficulty sitting down on and standing up from a chair with arms (e.g., wheelchair, bedside commode, etc,.)?: A Little Help needed moving to and from a bed to chair (including a wheelchair)?: A Little Help needed walking in hospital room?: A Little Help needed climbing 3-5 steps with a railing? : None 6 Click Score: 19    End of Session Equipment Utilized During Treatment: Gait belt;Right knee immobilizer Activity Tolerance: Patient tolerated treatment well Patient left: in chair;with call bell/phone within reach;with family/visitor present Nurse Communication: Mobility status;Other (comment)(pt ready for discharge papers) PT Visit Diagnosis: Other abnormalities of gait and mobility (R26.89);Muscle weakness (generalized) (M62.81);Unsteadiness on feet (R26.81);Pain Pain - Right/Left: Right Pain - part of body: Knee     Time: 5465-6812 PT Time Calculation (min) (ACUTE ONLY): 29 min  Charges:  $Gait Training: 8-22 mins $Therapeutic Exercise: 8-22 mins                    Willow Ora, PTA, CLT [facility] [employer] Office- [phone] 02/10/18, 10:49 AM   Willow Ora 02/10/2018, 10:47 AM

## 2018-02-10 NOTE — Progress Notes (Signed)
Subjective: 4 Days Post-Op Procedure(s) (LRB): TOTAL KNEE ARTHROPLASTY (Right) Patient reports pain as mild.  Take by mouth and voiding okay.  Wants to go home.  His brother will be home and helping him.  Progressing with physical therapy.  Objective: Vital signs in last 24 hours: Temp:  [98.6 F (37 C)-99.3 F (37.4 C)] 98.6 F (37 C) (05/11 0458) Pulse Rate:  [91-97] 93 (05/11 0458) Resp:  [18] 18 (05/11 0458) BP: (121-148)/(59-76) 142/76 (05/11 0458) SpO2:  [94 %-96 %] 96 % (05/11 0458)  Intake/Output from previous day: 05/10 0701 - 05/11 0700 In: 400 [P.O.:400] Out: 650 [Urine:650] Intake/Output this shift: No intake/output data recorded.  No results for input(s): HGB in the last 72 hours. No results for input(s): WBC, RBC, HCT, PLT in the last 72 hours. No results for input(s): NA, K, CL, CO2, BUN, CREATININE, GLUCOSE, CALCIUM in the last 72 hours. No results for input(s): LABPT, INR in the last 72 hours. Right knee exam: Neurologically intact Neurovascular intact Sensation intact distally Intact pulses distally Incision: dressing C/D/I Compartment soft  Anticipated LOS equal to or greater than 2 midnights due to - Age 80 and older with one or more of the following:  - Obesity  - Expected need for hospital services (PT, OT, Nursing) required for safe  discharge  - Anticipated need for postoperative skilled nursing care or inpatient rehab  - Active co-morbidities: Chronic pain requiring opiods OR   - Unanticipated findings during/Post Surgery: Slow post-op progression: GI, pain control, mobility  - Patient is a high risk of re-admission due to: Barriers to post-acute care (logistical, no family support in home)   Assessment/Plan: 4 Days Post-Op Procedure(s) (LRB): TOTAL KNEE ARTHROPLASTY (Right) Plan: Aspirin 325 mg twice daily for DVT prophylaxis. Weight-bear as tolerated on right lower extremity. Discharge home with home health Follow-up with Dr. Rhona Raider  in 2 weeks.   Erlene Senters 02/10/2018, 8:55 AM

## 2018-02-10 NOTE — Discharge Summary (Signed)
Patient ID: Anthony Wiley MRN: [id number] DOB/AGE: November 29, 1945 72 y.o.  Admit date: 02/06/2018 Discharge date: 02/10/2018  Admission Diagnoses:  Principal Problem:   Primary osteoarthritis of right knee   Discharge Diagnoses:  Same  Past Medical History:  Diagnosis Date  . Arthritis   . Hypertension     Surgeries: Procedure(s):RIGHT TOTAL KNEE ARTHROPLASTY on 02/06/2018   Consultants:   Discharged Condition: Improved  Hospital Course: Anthony Wiley is an 72 y.o. male who was admitted 02/06/2018 for operative treatment ofPrimary osteoarthritis of right knee. Patient has severe unremitting pain that affects sleep, daily activities, and work/hobbies. After pre-op clearance the patient was taken to the operating room on 02/06/2018 and underwent  Procedure(s):RIGHT TOTAL KNEE ARTHROPLASTY.    Patient was given perioperative antibiotics:  Anti-infectives (From admission, onward)   Start     Dose/Rate Route Frequency Ordered Stop   02/06/18 1900  ceFAZolin (ANCEF) IVPB 2g/100 mL premix     2 g 200 mL/hr over 30 Minutes Intravenous Every 6 hours 02/06/18 1502 02/07/18 0052   02/06/18 1230  ceFAZolin (ANCEF) IVPB 2g/100 mL premix     2 g 200 mL/hr over 30 Minutes Intravenous To ShortStay [facility] 02/05/18 1333 02/06/18 1305       Patient was given sequential compression devices, early ambulation, and chemoprophylaxis to prevent DVT.  Patient benefited maximally from hospital stay and there were no complications.    Recent vital signs:  Patient Vitals for the past 24 hrs:  BP Temp Temp src Pulse Resp SpO2  02/10/18 0458 (!) 142/76 98.6 F (37 C) Oral 93 18 96 %  02/09/18 2058 (!) 148/71 99 F (37.2 C) Oral 97 18 96 %  02/09/18 1300 (!) 121/59 99.3 F (37.4 C) Oral 91 18 94 %     Recent laboratory studies: No results for input(s): WBC, HGB, HCT, PLT, NA, K, CL, CO2, BUN, CREATININE, GLUCOSE, INR, CALCIUM in the last 72 hours.  Invalid input(s): PT, 2   Discharge  Medications:   Allergies as of 02/10/2018      Reactions   Ace Inhibitors    UNSPECIFIED REACTION       Medication List    STOP taking these medications   diclofenac 75 MG EC tablet Commonly known as:  VOLTAREN   naproxen sodium 220 MG tablet Commonly known as:  ALEVE   oxyCODONE-acetaminophen 5-325 MG tablet Commonly known as:  PERCOCET/ROXICET Replaced by:  oxyCODONE-acetaminophen 7.5-325 MG tablet     TAKE these medications   amLODipine 10 MG tablet Commonly known as:  NORVASC Take 10 mg by mouth daily.   aspirin 325 MG EC tablet Take 1 tablet (325 mg total) by mouth 2 (two) times daily after a meal.   bisacodyl 5 MG EC tablet Commonly known as:  DULCOLAX Take 1 tablet (5 mg total) by mouth daily as needed for moderate constipation.   docusate sodium 100 MG capsule Commonly known as:  COLACE Take 1 capsule (100 mg total) by mouth 2 (two) times daily.   fluticasone 50 MCG/ACT nasal spray Commonly known as:  FLONASE Place 1 spray into the nose daily as needed for allergies.   oxyCODONE-acetaminophen 7.5-325 MG tablet Commonly known as:  PERCOCET Take 1-2 tablets by mouth every 4 (four) hours as needed for severe pain. Patient had right total knee 02/07/2018   Patient has been on this medication in the hospital for 3 days with good pain control and no adverse events Replaces:  oxyCODONE-acetaminophen 5-325 MG tablet  tiZANidine 4 MG tablet Commonly known as:  ZANAFLEX Take 1 tablet (4 mg total) by mouth every 6 (six) hours as needed.            Durable Medical Equipment  (From admission, onward)        Start     Ordered   02/06/18 1807  DME Walker rolling  Once    Question:  Patient needs a walker to treat with the following condition  Answer:  Primary osteoarthritis of right knee   02/06/18 1806   02/06/18 1807  DME 3 n 1  Once     02/06/18 1806   02/06/18 1807  DME Bedside commode  Once    Question:  Patient needs a bedside commode to treat with the  following condition  Answer:  Primary osteoarthritis of right knee   02/06/18 1806       Discharge Care Instructions  (From admission, onward)        Start     Ordered   02/10/18 0000  Weight bearing as tolerated    Question Answer Comment  Laterality right   Extremity Lower      02/10/18 0858      Diagnostic Studies: Dg Chest 2 View  Result Date: 01/31/2018 CLINICAL DATA:  Preop knee replacement EXAM: CHEST - 2 VIEW COMPARISON:  04/20/2011 FINDINGS: The heart size and mediastinal contours are within normal limits. Both lungs are clear. The visualized skeletal structures are unremarkable. IMPRESSION: No active cardiopulmonary disease. Electronically Signed   By: Franchot Gallo M.D.   On: 01/31/2018 15:08    Disposition: Discharge disposition: 01-Home or Self Care       Discharge Instructions    Call MD / Call 911   Complete by:  As directed    If you experience chest pain or shortness of breath, CALL 911 and be transported to the hospital emergency room.  If you develope a fever above 101 F, pus (white drainage) or increased drainage or redness at the wound, or calf pain, call your surgeon's office.   Constipation Prevention   Complete by:  As directed    Drink plenty of fluids.  Prune juice may be helpful.  You may use a stool softener, such as Colace (over the counter) 100 mg twice a day.  Use MiraLax (over the counter) for constipation as needed.   Diet - low sodium heart healthy   Complete by:  As directed    Do not put a pillow under the knee. Place it under the heel.   Complete by:  As directed    Increase activity slowly as tolerated   Complete by:  As directed    Weight bearing as tolerated   Complete by:  As directed    Laterality:  right   Extremity:  Lower      Follow-up Information    Melrose Nakayama, MD. Schedule an appointment as soon as possible for a visit in 2 weeks.   Specialty:  Orthopedic Surgery Contact information: [place] [address] [phone]        [facility] Follow up.   Specialty:  [place] Why:  A representative from [facility] will contact you to arrange start date and time for your therapy.  Contact information: [address] [place] [address] [phone]            Signed: Erlene Senters 02/10/2018, 12:58 PM

## 2018-02-10 NOTE — Progress Notes (Signed)
Reviewed discharge paperwork and all prescriptions with brother and patient with full understanding

## 2018-02-11 DIAGNOSIS — I10 Essential (primary) hypertension: Secondary | ICD-10-CM | POA: Diagnosis not present

## 2018-02-11 DIAGNOSIS — Z6835 Body mass index (BMI) 35.0-35.9, adult: Secondary | ICD-10-CM | POA: Diagnosis not present

## 2018-02-11 DIAGNOSIS — Z471 Aftercare following joint replacement surgery: Secondary | ICD-10-CM | POA: Diagnosis not present

## 2018-02-11 DIAGNOSIS — Z87891 Personal history of nicotine dependence: Secondary | ICD-10-CM | POA: Diagnosis not present

## 2018-02-11 DIAGNOSIS — Z96651 Presence of right artificial knee joint: Secondary | ICD-10-CM | POA: Diagnosis not present

## 2018-02-11 DIAGNOSIS — E669 Obesity, unspecified: Secondary | ICD-10-CM | POA: Diagnosis not present

## 2018-02-12 DIAGNOSIS — Z6835 Body mass index (BMI) 35.0-35.9, adult: Secondary | ICD-10-CM | POA: Diagnosis not present

## 2018-02-12 DIAGNOSIS — I10 Essential (primary) hypertension: Secondary | ICD-10-CM | POA: Diagnosis not present

## 2018-02-12 DIAGNOSIS — Z471 Aftercare following joint replacement surgery: Secondary | ICD-10-CM | POA: Diagnosis not present

## 2018-02-12 DIAGNOSIS — Z87891 Personal history of nicotine dependence: Secondary | ICD-10-CM | POA: Diagnosis not present

## 2018-02-12 DIAGNOSIS — Z96651 Presence of right artificial knee joint: Secondary | ICD-10-CM | POA: Diagnosis not present

## 2018-02-12 DIAGNOSIS — E669 Obesity, unspecified: Secondary | ICD-10-CM | POA: Diagnosis not present

## 2018-02-13 DIAGNOSIS — Z96651 Presence of right artificial knee joint: Secondary | ICD-10-CM | POA: Diagnosis not present

## 2018-02-13 DIAGNOSIS — Z471 Aftercare following joint replacement surgery: Secondary | ICD-10-CM | POA: Diagnosis not present

## 2018-02-13 DIAGNOSIS — Z6835 Body mass index (BMI) 35.0-35.9, adult: Secondary | ICD-10-CM | POA: Diagnosis not present

## 2018-02-13 DIAGNOSIS — E669 Obesity, unspecified: Secondary | ICD-10-CM | POA: Diagnosis not present

## 2018-02-13 DIAGNOSIS — Z87891 Personal history of nicotine dependence: Secondary | ICD-10-CM | POA: Diagnosis not present

## 2018-02-13 DIAGNOSIS — I10 Essential (primary) hypertension: Secondary | ICD-10-CM | POA: Diagnosis not present

## 2018-02-14 DIAGNOSIS — I10 Essential (primary) hypertension: Secondary | ICD-10-CM | POA: Diagnosis not present

## 2018-02-14 DIAGNOSIS — E669 Obesity, unspecified: Secondary | ICD-10-CM | POA: Diagnosis not present

## 2018-02-14 DIAGNOSIS — Z96651 Presence of right artificial knee joint: Secondary | ICD-10-CM | POA: Diagnosis not present

## 2018-02-14 DIAGNOSIS — Z6835 Body mass index (BMI) 35.0-35.9, adult: Secondary | ICD-10-CM | POA: Diagnosis not present

## 2018-02-14 DIAGNOSIS — Z87891 Personal history of nicotine dependence: Secondary | ICD-10-CM | POA: Diagnosis not present

## 2018-02-14 DIAGNOSIS — Z471 Aftercare following joint replacement surgery: Secondary | ICD-10-CM | POA: Diagnosis not present

## 2018-02-15 DIAGNOSIS — E669 Obesity, unspecified: Secondary | ICD-10-CM | POA: Diagnosis not present

## 2018-02-15 DIAGNOSIS — Z96651 Presence of right artificial knee joint: Secondary | ICD-10-CM | POA: Diagnosis not present

## 2018-02-15 DIAGNOSIS — I10 Essential (primary) hypertension: Secondary | ICD-10-CM | POA: Diagnosis not present

## 2018-02-15 DIAGNOSIS — Z87891 Personal history of nicotine dependence: Secondary | ICD-10-CM | POA: Diagnosis not present

## 2018-02-15 DIAGNOSIS — Z6835 Body mass index (BMI) 35.0-35.9, adult: Secondary | ICD-10-CM | POA: Diagnosis not present

## 2018-02-15 DIAGNOSIS — Z471 Aftercare following joint replacement surgery: Secondary | ICD-10-CM | POA: Diagnosis not present

## 2018-02-16 DIAGNOSIS — R262 Difficulty in walking, not elsewhere classified: Secondary | ICD-10-CM | POA: Diagnosis not present

## 2018-02-16 DIAGNOSIS — M1711 Unilateral primary osteoarthritis, right knee: Secondary | ICD-10-CM | POA: Diagnosis not present

## 2018-02-16 DIAGNOSIS — Z96659 Presence of unspecified artificial knee joint: Secondary | ICD-10-CM | POA: Diagnosis not present

## 2018-02-16 DIAGNOSIS — M25561 Pain in right knee: Secondary | ICD-10-CM | POA: Diagnosis not present

## 2018-02-16 DIAGNOSIS — Z96651 Presence of right artificial knee joint: Secondary | ICD-10-CM | POA: Diagnosis not present

## 2018-02-16 DIAGNOSIS — Z471 Aftercare following joint replacement surgery: Secondary | ICD-10-CM | POA: Diagnosis not present

## 2018-02-20 DIAGNOSIS — Z96651 Presence of right artificial knee joint: Secondary | ICD-10-CM | POA: Diagnosis not present

## 2018-02-20 DIAGNOSIS — M25561 Pain in right knee: Secondary | ICD-10-CM | POA: Diagnosis not present

## 2018-02-20 DIAGNOSIS — R262 Difficulty in walking, not elsewhere classified: Secondary | ICD-10-CM | POA: Diagnosis not present

## 2018-02-20 DIAGNOSIS — Z471 Aftercare following joint replacement surgery: Secondary | ICD-10-CM | POA: Diagnosis not present

## 2018-02-22 DIAGNOSIS — Z471 Aftercare following joint replacement surgery: Secondary | ICD-10-CM | POA: Diagnosis not present

## 2018-02-22 DIAGNOSIS — Z96651 Presence of right artificial knee joint: Secondary | ICD-10-CM | POA: Diagnosis not present

## 2018-02-22 DIAGNOSIS — R262 Difficulty in walking, not elsewhere classified: Secondary | ICD-10-CM | POA: Diagnosis not present

## 2018-02-22 DIAGNOSIS — M25561 Pain in right knee: Secondary | ICD-10-CM | POA: Diagnosis not present

## 2018-02-28 DIAGNOSIS — Z471 Aftercare following joint replacement surgery: Secondary | ICD-10-CM | POA: Diagnosis not present

## 2018-02-28 DIAGNOSIS — M79661 Pain in right lower leg: Secondary | ICD-10-CM | POA: Diagnosis not present

## 2018-02-28 DIAGNOSIS — Z96651 Presence of right artificial knee joint: Secondary | ICD-10-CM | POA: Diagnosis not present

## 2018-02-28 DIAGNOSIS — R262 Difficulty in walking, not elsewhere classified: Secondary | ICD-10-CM | POA: Diagnosis not present

## 2018-02-28 DIAGNOSIS — M25561 Pain in right knee: Secondary | ICD-10-CM | POA: Diagnosis not present

## 2018-02-28 DIAGNOSIS — M7989 Other specified soft tissue disorders: Secondary | ICD-10-CM | POA: Diagnosis not present

## 2018-03-01 DIAGNOSIS — M25561 Pain in right knee: Secondary | ICD-10-CM | POA: Diagnosis not present

## 2018-03-01 DIAGNOSIS — Z96651 Presence of right artificial knee joint: Secondary | ICD-10-CM | POA: Diagnosis not present

## 2018-03-01 DIAGNOSIS — Z471 Aftercare following joint replacement surgery: Secondary | ICD-10-CM | POA: Diagnosis not present

## 2018-03-01 DIAGNOSIS — R262 Difficulty in walking, not elsewhere classified: Secondary | ICD-10-CM | POA: Diagnosis not present

## 2018-03-05 DIAGNOSIS — R262 Difficulty in walking, not elsewhere classified: Secondary | ICD-10-CM | POA: Diagnosis not present

## 2018-03-05 DIAGNOSIS — M25561 Pain in right knee: Secondary | ICD-10-CM | POA: Diagnosis not present

## 2018-03-05 DIAGNOSIS — Z471 Aftercare following joint replacement surgery: Secondary | ICD-10-CM | POA: Diagnosis not present

## 2018-03-05 DIAGNOSIS — Z96651 Presence of right artificial knee joint: Secondary | ICD-10-CM | POA: Diagnosis not present

## 2018-03-07 DIAGNOSIS — R262 Difficulty in walking, not elsewhere classified: Secondary | ICD-10-CM | POA: Diagnosis not present

## 2018-03-07 DIAGNOSIS — Z96651 Presence of right artificial knee joint: Secondary | ICD-10-CM | POA: Diagnosis not present

## 2018-03-07 DIAGNOSIS — M25561 Pain in right knee: Secondary | ICD-10-CM | POA: Diagnosis not present

## 2018-03-07 DIAGNOSIS — Z471 Aftercare following joint replacement surgery: Secondary | ICD-10-CM | POA: Diagnosis not present

## 2018-03-14 DIAGNOSIS — Z96651 Presence of right artificial knee joint: Secondary | ICD-10-CM | POA: Diagnosis not present

## 2018-03-14 DIAGNOSIS — M25561 Pain in right knee: Secondary | ICD-10-CM | POA: Diagnosis not present

## 2018-03-14 DIAGNOSIS — R262 Difficulty in walking, not elsewhere classified: Secondary | ICD-10-CM | POA: Diagnosis not present

## 2018-03-14 DIAGNOSIS — Z471 Aftercare following joint replacement surgery: Secondary | ICD-10-CM | POA: Diagnosis not present

## 2018-03-21 DIAGNOSIS — Z471 Aftercare following joint replacement surgery: Secondary | ICD-10-CM | POA: Diagnosis not present

## 2018-03-21 DIAGNOSIS — R262 Difficulty in walking, not elsewhere classified: Secondary | ICD-10-CM | POA: Diagnosis not present

## 2018-03-21 DIAGNOSIS — M25561 Pain in right knee: Secondary | ICD-10-CM | POA: Diagnosis not present

## 2018-03-21 DIAGNOSIS — Z96651 Presence of right artificial knee joint: Secondary | ICD-10-CM | POA: Diagnosis not present

## 2018-04-03 DIAGNOSIS — Z96651 Presence of right artificial knee joint: Secondary | ICD-10-CM | POA: Diagnosis not present

## 2018-04-03 DIAGNOSIS — I10 Essential (primary) hypertension: Secondary | ICD-10-CM | POA: Diagnosis not present

## 2018-04-03 DIAGNOSIS — M25561 Pain in right knee: Secondary | ICD-10-CM | POA: Diagnosis not present

## 2018-04-06 DIAGNOSIS — Z471 Aftercare following joint replacement surgery: Secondary | ICD-10-CM | POA: Diagnosis not present

## 2018-04-06 DIAGNOSIS — M25561 Pain in right knee: Secondary | ICD-10-CM | POA: Diagnosis not present

## 2018-04-06 DIAGNOSIS — Z96651 Presence of right artificial knee joint: Secondary | ICD-10-CM | POA: Diagnosis not present

## 2018-04-19 DIAGNOSIS — M25512 Pain in left shoulder: Secondary | ICD-10-CM | POA: Diagnosis not present

## 2018-04-19 DIAGNOSIS — Z96651 Presence of right artificial knee joint: Secondary | ICD-10-CM | POA: Diagnosis not present

## 2018-04-19 DIAGNOSIS — M25561 Pain in right knee: Secondary | ICD-10-CM | POA: Diagnosis not present

## 2018-04-19 DIAGNOSIS — M25511 Pain in right shoulder: Secondary | ICD-10-CM | POA: Diagnosis not present

## 2018-05-09 DIAGNOSIS — M25511 Pain in right shoulder: Secondary | ICD-10-CM | POA: Diagnosis not present

## 2018-05-09 DIAGNOSIS — Z96651 Presence of right artificial knee joint: Secondary | ICD-10-CM | POA: Diagnosis not present

## 2018-05-10 DIAGNOSIS — B359 Dermatophytosis, unspecified: Secondary | ICD-10-CM | POA: Diagnosis not present

## 2018-05-10 DIAGNOSIS — I10 Essential (primary) hypertension: Secondary | ICD-10-CM | POA: Diagnosis not present

## 2018-05-10 DIAGNOSIS — R600 Localized edema: Secondary | ICD-10-CM | POA: Diagnosis not present

## 2018-05-11 DIAGNOSIS — R600 Localized edema: Secondary | ICD-10-CM | POA: Diagnosis not present

## 2018-05-17 DIAGNOSIS — R600 Localized edema: Secondary | ICD-10-CM | POA: Diagnosis not present

## 2018-05-17 DIAGNOSIS — G8929 Other chronic pain: Secondary | ICD-10-CM | POA: Diagnosis not present

## 2018-05-17 DIAGNOSIS — I10 Essential (primary) hypertension: Secondary | ICD-10-CM | POA: Diagnosis not present

## 2018-05-17 DIAGNOSIS — M25561 Pain in right knee: Secondary | ICD-10-CM | POA: Diagnosis not present

## 2018-05-28 DIAGNOSIS — Z23 Encounter for immunization: Secondary | ICD-10-CM | POA: Diagnosis not present

## 2018-05-31 DIAGNOSIS — R600 Localized edema: Secondary | ICD-10-CM | POA: Diagnosis not present

## 2018-05-31 DIAGNOSIS — H6982 Other specified disorders of Eustachian tube, left ear: Secondary | ICD-10-CM | POA: Diagnosis not present

## 2018-05-31 DIAGNOSIS — I10 Essential (primary) hypertension: Secondary | ICD-10-CM | POA: Diagnosis not present

## 2018-06-25 DIAGNOSIS — M25512 Pain in left shoulder: Secondary | ICD-10-CM | POA: Diagnosis not present

## 2018-06-25 DIAGNOSIS — M25561 Pain in right knee: Secondary | ICD-10-CM | POA: Diagnosis not present

## 2018-06-25 DIAGNOSIS — M25511 Pain in right shoulder: Secondary | ICD-10-CM | POA: Diagnosis not present

## 2018-06-25 DIAGNOSIS — Z96651 Presence of right artificial knee joint: Secondary | ICD-10-CM | POA: Diagnosis not present

## 2018-07-08 DIAGNOSIS — Z23 Encounter for immunization: Secondary | ICD-10-CM | POA: Diagnosis not present

## 2018-07-21 DIAGNOSIS — M25561 Pain in right knee: Secondary | ICD-10-CM | POA: Diagnosis not present

## 2018-07-21 DIAGNOSIS — Z96659 Presence of unspecified artificial knee joint: Secondary | ICD-10-CM | POA: Diagnosis not present

## 2018-07-21 DIAGNOSIS — Z96651 Presence of right artificial knee joint: Secondary | ICD-10-CM | POA: Diagnosis not present

## 2018-08-06 DIAGNOSIS — M545 Low back pain: Secondary | ICD-10-CM | POA: Diagnosis not present

## 2018-08-27 DIAGNOSIS — M25511 Pain in right shoulder: Secondary | ICD-10-CM | POA: Diagnosis not present

## 2018-08-29 DIAGNOSIS — M25512 Pain in left shoulder: Secondary | ICD-10-CM | POA: Diagnosis not present

## 2018-08-29 DIAGNOSIS — M25511 Pain in right shoulder: Secondary | ICD-10-CM | POA: Diagnosis not present

## 2018-08-29 DIAGNOSIS — M545 Low back pain: Secondary | ICD-10-CM | POA: Diagnosis not present

## 2018-09-03 ENCOUNTER — Other Ambulatory Visit: Payer: Self-pay | Admitting: Orthopaedic Surgery

## 2018-09-03 DIAGNOSIS — M545 Low back pain, unspecified: Secondary | ICD-10-CM

## 2018-09-15 ENCOUNTER — Other Ambulatory Visit: Payer: Medicare Other

## 2018-09-15 ENCOUNTER — Ambulatory Visit
Admission: RE | Admit: 2018-09-15 | Discharge: 2018-09-15 | Disposition: A | Discharge status: Home / Self Care | Payer: Medicare Other | Source: Ambulatory Visit | Attending: Orthopaedic Surgery | Admitting: Orthopaedic Surgery

## 2018-09-15 DIAGNOSIS — M5126 Other intervertebral disc displacement, lumbar region: Secondary | ICD-10-CM | POA: Diagnosis not present

## 2018-09-15 DIAGNOSIS — M545 Low back pain, unspecified: Secondary | ICD-10-CM

## 2018-09-19 DIAGNOSIS — M25561 Pain in right knee: Secondary | ICD-10-CM | POA: Diagnosis not present

## 2019-03-11 ENCOUNTER — Encounter (HOSPITAL_COMMUNITY): Payer: Self-pay | Admitting: Emergency Medicine

## 2019-03-11 ENCOUNTER — Emergency Department (HOSPITAL_COMMUNITY)
Admission: EM | Admit: 2019-03-11 | Discharge: 2019-03-11 | Disposition: A | Discharge status: Home / Self Care | Payer: Medicare Other | Attending: Emergency Medicine | Admitting: Emergency Medicine

## 2019-03-11 ENCOUNTER — Other Ambulatory Visit: Payer: Self-pay

## 2019-03-11 DIAGNOSIS — L98499 Non-pressure chronic ulcer of skin of other sites with unspecified severity: Secondary | ICD-10-CM

## 2019-03-11 DIAGNOSIS — F17220 Nicotine dependence, chewing tobacco, uncomplicated: Secondary | ICD-10-CM | POA: Insufficient documentation

## 2019-03-11 DIAGNOSIS — Z79899 Other long term (current) drug therapy: Secondary | ICD-10-CM | POA: Diagnosis not present

## 2019-03-11 DIAGNOSIS — B356 Tinea cruris: Secondary | ICD-10-CM | POA: Insufficient documentation

## 2019-03-11 DIAGNOSIS — R21 Rash and other nonspecific skin eruption: Secondary | ICD-10-CM | POA: Diagnosis present

## 2019-03-11 DIAGNOSIS — I10 Essential (primary) hypertension: Secondary | ICD-10-CM | POA: Insufficient documentation

## 2019-03-11 LAB — CBC WITH DIFFERENTIAL/PLATELET
Abs Immature Granulocytes: 0.03 10*3/uL (ref 0.00–0.07)
Basophils Absolute: 0.1 10*3/uL (ref 0.0–0.1)
Basophils Relative: 1 %
Eosinophils Absolute: 0 10*3/uL (ref 0.0–0.5)
Eosinophils Relative: 1 %
HCT: 46.9 % (ref 39.0–52.0)
Hemoglobin: 14.9 g/dL (ref 13.0–17.0)
Immature Granulocytes: 0 %
Lymphocytes Relative: 18 %
Lymphs Abs: 1.5 10*3/uL (ref 0.7–4.0)
MCH: 31.2 pg (ref 26.0–34.0)
MCHC: 31.8 g/dL (ref 30.0–36.0)
MCV: 98.3 fL (ref 80.0–100.0)
Monocytes Absolute: 0.7 10*3/uL (ref 0.1–1.0)
Monocytes Relative: 9 %
Neutro Abs: 6 10*3/uL (ref 1.7–7.7)
Neutrophils Relative %: 71 %
Platelets: 234 10*3/uL (ref 150–400)
RBC: 4.77 MIL/uL (ref 4.22–5.81)
RDW: 13 % (ref 11.5–15.5)
WBC: 8.4 10*3/uL (ref 4.0–10.5)
nRBC: 0 % (ref 0.0–0.2)

## 2019-03-11 LAB — COMPREHENSIVE METABOLIC PANEL
ALT: 16 U/L (ref 0–44)
AST: 18 U/L (ref 15–41)
Albumin: 3.8 g/dL (ref 3.5–5.0)
Alkaline Phosphatase: 53 U/L (ref 38–126)
Anion gap: 8 (ref 5–15)
BUN: 12 mg/dL (ref 8–23)
CO2: 25 mmol/L (ref 22–32)
Calcium: 8.9 mg/dL (ref 8.9–10.3)
Chloride: 102 mmol/L (ref 98–111)
Creatinine, Ser: 0.71 mg/dL (ref 0.61–1.24)
GFR calc Af Amer: >60 mL/min (ref >60)
GFR calc non Af Amer: >60 mL/min (ref >60)
Glucose, Bld: 86 mg/dL (ref 70–99)
Potassium: 3.9 mmol/L (ref 3.5–5.1)
Sodium: 135 mmol/L (ref 135–145)
Total Bilirubin: 1.3 mg/dL — ABNORMAL HIGH (ref 0.3–1.2)
Total Protein: 7 g/dL (ref 6.5–8.1)

## 2019-03-11 LAB — C-REACTIVE PROTEIN: CRP: <0.8 mg/dL (ref <1.0)

## 2019-03-11 LAB — SEDIMENTATION RATE: Sed Rate: 5 mm/hr (ref 0–16)

## 2019-03-11 LAB — LACTIC ACID, PLASMA
Lactic Acid, Venous: 1 mmol/L (ref 0.5–1.9)
Lactic Acid, Venous: 1.7 mmol/L (ref 0.5–1.9)

## 2019-03-11 MED ORDER — FAMOTIDINE 20 MG PO TABS
40.0000 mg | ORAL_TABLET | Freq: Once | ORAL | Status: AC
  Administered 2019-03-11: 40 mg via ORAL
  Filled 2019-03-11: qty 2

## 2019-03-11 MED ORDER — MICONAZOLE NITRATE 2 % EX CREA: 1.0000 "application " | TOPICAL_CREAM | Freq: Three times a day (TID) | CUTANEOUS | 0 refills | Status: AC

## 2019-03-11 MED ORDER — MICONAZOLE NITRATE 2 % EX CREA
TOPICAL_CREAM | Freq: Once | CUTANEOUS | Status: AC
  Administered 2019-03-11: 1 via TOPICAL
  Filled 2019-03-11: qty 28

## 2019-03-11 MED ORDER — FLUCONAZOLE 100 MG PO TABS: 200.0000 mg | ORAL_TABLET | Freq: Every day | ORAL | 0 refills | Status: AC

## 2019-03-11 MED ORDER — FLUCONAZOLE 200 MG PO TABS
200.0000 mg | ORAL_TABLET | Freq: Once | ORAL | Status: AC
  Administered 2019-03-11: 200 mg via ORAL
  Filled 2019-03-11: qty 1

## 2019-03-11 NOTE — ED Triage Notes (Signed)
PT HAS RASH IN LOWER ABD AND GROIN THAT IS NOT BEING RELIEVED WITH PO ANTIBIOTICS. DR BADGER SENT PT TO BE ADMITTED. REPORTS GROIN AREA IS VERY PAINFUL

## 2019-03-11 NOTE — ED Provider Notes (Signed)
[facility] DEPT Provider Note   CSN: [id number] Arrival date & time: 03/11/19  1352    History   Chief Complaint Chief Complaint  Patient presents with  . Rash    HPI Anthony Wiley is a 73 y.o. male.     HPI Patient was seen by Dr. Melford Aase his PCP 8 days ago for a bad rash in the groin and suprapubic area.  That note indicates that the problem started approximately a month earlier.  Patient reports this is been a problem for several weeks.  He denies that prior to this severe rash that he has, that he has had any significant problems with ongoing rash or infection in the groin.  He was diagnosed with tinea cruris 8 days ago.  Patient was started on Diflucan 100 mg twice daily, nystatin powder 4 times daily and doxycycline 100 mg twice daily.  Patient reports he has been taking these medications as prescribed.  He reports he has been cleaning the groin area 4 times a day and applying his powders.  It continues to worsen.  He reports there is severe burning and pain.  He was seen by Dr. Melford Aase today and sent to the emergency department for anticipated admission.  Patient denies he said fever chills nausea or vomiting.  Denies he has any other rash or lesions.  No oral lesions. Past Medical History:  Diagnosis Date  . Arthritis   . Hypertension     Patient Active Problem List   Diagnosis Date Noted  . Primary osteoarthritis of right knee 02/06/2018    Past Surgical History:  Procedure Laterality Date  . BACK SURGERY    . JOINT REPLACEMENT    . KNEE ARTHROSCOPY Right   . NASAL SINUS SURGERY    . 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 Authorizing Provider  doxycycline (VIBRAMYCIN) 100 MG capsule Take 100 mg by  mouth 2 (two) times daily.  03/04/19  Yes [provider]  hydrocortisone 2.5 % cream Apply 1 application topically 2 (two) times daily.  02/18/19  Yes [provider]  nystatin (MYCOSTATIN/NYSTOP) powder Apply 1 Bottle topically 2 (two) times daily.  03/04/19  Yes [provider]  oxyCODONE-acetaminophen (PERCOCET/ROXICET) 5-325 MG tablet Take 1 tablet by mouth every 6 (six) hours as needed for moderate pain or severe pain.  02/26/19  Yes [provider]  aspirin EC 325 MG EC tablet Take 1 tablet (325 mg total) by mouth 2 (two) times daily after a meal. Patient not taking: Reported on 03/11/2019 02/09/18   Loni Dolly, PA-C  bisacodyl (DULCOLAX) 5 MG EC tablet Take 1 tablet (5 mg total) by mouth daily as needed for moderate constipation. Patient not taking: Reported on 03/11/2019 02/09/18   Loni Dolly, PA-C  docusate sodium (COLACE) 100 MG capsule Take 1 capsule (100 mg total) by mouth 2 (two) times daily. Patient not taking: Reported on 03/11/2019 02/09/18   Loni Dolly, PA-C  fluconazole (DIFLUCAN) 100 MG tablet Take 2 tablets (200 mg total) by mouth daily. 03/11/19   Charlesetta Shanks, MD  fluticasone (FLONASE) 50 MCG/ACT nasal spray Place 1 spray into the nose daily as needed for allergies.     [provider]  gabapentin (NEURONTIN)  300 MG capsule Take 300 mg by mouth 2 (two) times daily as needed (pain).  01/01/19   [provider]  meloxicam (MOBIC) 15 MG tablet Take 15 mg by mouth daily as needed for pain.  01/23/19   [provider]  miconazole (MICOTIN) 2 % cream Apply 1 application topically 3 (three) times daily. 03/11/19   Charlesetta Shanks, MD  oxyCODONE-acetaminophen (PERCOCET) 7.5-325 MG tablet Take 1-2 tablets by mouth every 4 (four) hours as needed for severe pain. Patient had right total knee 02/07/2018   Patient has been on this medication in the hospital for 3 days with good pain control and no adverse events Patient not taking: Reported  on 03/11/2019 02/10/18   Matthew Saras, PA-C    Family History No family history on file.  Social History Social History   Tobacco Use  . Smoking status: Former Smoker    Packs/day: 4.00    Years: 45.00    Pack years: 180.00    Types: Cigarettes    Last attempt to quit: 10/03/1997    Years since quitting: 21.4  . Smokeless tobacco: Current User    Types: Chew  . Tobacco comment: 02/07/2018 "chew tobacco q now and then"  Substance Use Topics  . Alcohol use: Yes    Alcohol/week: 13.0 standard drinks    Types: 13 Cans of beer per week  . Drug use: Never     Allergies   Ace inhibitors   Review of Systems Review of Systems 10 Systems reviewed and are negative for acute change except as noted in the HPI.   Physical Exam Updated Vital Signs BP (!) 157/89 (BP Location: Left Arm)   Pulse 85   Temp 98.1 F (36.7 C) (Oral)   Resp 17   SpO2 97%   Physical Exam Constitutional:      Comments: Patient is alert and appropriate, clear mental status, some central obesity and physical deconditioning.  HENT:     Head: Normocephalic and atraumatic.     Mouth/Throat:     Mouth: Mucous membranes are moist.     Pharynx: Oropharynx is clear.  Eyes:     Extraocular Movements: Extraocular movements intact.     Conjunctiva/sclera: Conjunctivae normal.     Pupils: Pupils are equal, round, and reactive to light.  Cardiovascular:     Rate and Rhythm: Normal rate and regular rhythm.  Pulmonary:     Effort: Pulmonary effort is normal.     Breath sounds: Normal breath sounds.  Abdominal:     General: There is no distension.     Palpations: Abdomen is soft.     Tenderness: There is no abdominal tenderness. There is no guarding.  Genitourinary:    Comments: Severe rash and large erosions.  See attached images. Musculoskeletal: Normal range of motion.        General: No swelling or tenderness.     Right lower leg: No edema.     Left lower leg: No edema.  Skin:    General: Skin is  warm and dry.  Neurological:     General: No focal deficit present.     Mental Status: He is oriented to person, place, and time.     Coordination: Coordination normal.  Psychiatric:        Mood and Affect: Mood normal.            ED Treatments / Results  Labs (all labs ordered are listed, but only abnormal results are displayed) Labs Reviewed  COMPREHENSIVE METABOLIC PANEL - Abnormal; Notable for the following components:      Result Value   Total Bilirubin 1.3 (*)    All other components within normal limits  CULTURE, BLOOD (ROUTINE X 2)  CULTURE, BLOOD (ROUTINE X 2)  CBC WITH DIFFERENTIAL/PLATELET  SEDIMENTATION RATE  LACTIC ACID, PLASMA  C-REACTIVE PROTEIN  LACTIC ACID, PLASMA    EKG None  Radiology No results found.  Procedures Procedures (including critical care time)  Medications Ordered in ED Medications  fluconazole (DIFLUCAN) tablet 200 mg (200 mg Oral Given 03/11/19 1922)  miconazole (MICOTIN) 2 % cream (1 application Topical Given 03/11/19 1923)  famotidine (PEPCID) tablet 40 mg (40 mg Oral Given 03/11/19 2018)     Initial Impression / Assessment and Plan / ED Course  I have reviewed the triage vital signs and the nursing notes.  Pertinent labs & imaging results that were available during my care of the patient were reviewed by me and considered in my medical decision making (see chart for details).       Consult: Reviewed with Dr. Linus Salmons of infectious disease.  We discussed the distribution of the rash and the ulcerative lesions.  Will increase the patient's Diflucan to 200 mg daily and counseled on dressing of the ulcers.  Dr. Linus Salmons advises he will have the patient seen in follow-up within the next couple of days to confirm if this appears consistent with a severe and ulcerative yeast dermatitis.  Patient has severe rash but distribution is still very consistent with a tinea cruris.  The imaged ulcers are occurring where there is opposing skin  surfaces.  Patient's review of systems is otherwise negative and he has no other areas of ulcers or skin irregularity.  No oral ulcers or other rashes.  This was reviewed with ID.  He will be seen within the next couple of days to confirm if this is consistent with a severe case of tinea complicated by chronic mechanical skin irritation due to opposing skin surfaces.  Patient is otherwise medically well in appearance.  He has been counseled and instructed on ways to dress with gauze and protect the lesions.  Final Clinical Impressions(s) / ED Diagnoses   Final diagnoses:  Superficial ulcerative lesion ([place])  Tinea cruris    ED Discharge Orders         Ordered    fluconazole (DIFLUCAN) 100 MG tablet  Daily     03/11/19 2058    miconazole (MICOTIN) 2 % cream  3 times daily     03/11/19 2058           Charlesetta Shanks, MD 03/11/19 2102

## 2019-03-11 NOTE — Discharge Instructions (Signed)
1.  Apply the miconazole cream to your rash 3 times a day.  You must apply clean and dry gauze over all of your ulcer lesions and keep them protected.  This has been reviewed in the emergency department.  It is very important that these are not allowed to be in a moist skin fold. 2.  Increase your dose of Diflucan to 200 mg a day. 3.  You will be seen at the infectious disease office within the next couple of days.  You should get a call tomorrow with a scheduled time.  Case was reviewed with Dr. Linus Salmons, the contact information is listed in your discharge instructions.

## 2019-03-12 ENCOUNTER — Telehealth: Payer: Self-pay

## 2019-03-12 NOTE — Telephone Encounter (Signed)
Unable to reach patent at listed phone number. Called his sister who is listed as EC. She provided office with updated phone number at [phone] (undated demographics in epic)  Spoke with patient and scheduled [facility] appointment as Dr. Linus Salmons requested for severe case of tinea complicated by chronic mechanical skin irritation.  Patient made aware of appointment date/time. Patient states he will call the office back once he returns home to write everything down including address.  Eugenia Mcalpine, LPN

## 2019-03-12 NOTE — Telephone Encounter (Signed)
Opened in error

## 2019-03-12 NOTE — Telephone Encounter (Signed)
-----   Message from Thayer Headings, MD sent at 03/12/2019  9:19 AM EDT ----- Can you get him in for a new patient/same day appt with someone today or tomorrow for Candidal rash not responsive to treatment?  Seen in the ED.  thanks

## 2019-03-13 ENCOUNTER — Telehealth: Payer: Self-pay | Admitting: Family

## 2019-03-13 NOTE — Telephone Encounter (Signed)
Confirmed with patient.

## 2019-03-13 NOTE — Telephone Encounter (Signed)
COVID-19 Pre-Screening Questions: ° °Do you currently have a fever (>100 °F), chills or unexplained body aches? No  ° °Are you currently experiencing new cough, shortness of breath, sore throat, runny nose?no   °•  °Have you recently travelled outside the state of [place] in the last 14 days? No   °•  °Have you been in contact with someone that is currently pending confirmation of Covid19 testing or has been confirmed to have the Covid19 virus?  No  °

## 2019-03-14 ENCOUNTER — Telehealth: Payer: Self-pay

## 2019-03-14 ENCOUNTER — Encounter: Payer: Self-pay | Admitting: Family

## 2019-03-14 ENCOUNTER — Ambulatory Visit (INDEPENDENT_AMBULATORY_CARE_PROVIDER_SITE_OTHER): Payer: Medicare Other | Admitting: Family

## 2019-03-14 ENCOUNTER — Other Ambulatory Visit: Payer: Self-pay

## 2019-03-14 DIAGNOSIS — L98499 Non-pressure chronic ulcer of skin of other sites with unspecified severity: Secondary | ICD-10-CM

## 2019-03-14 DIAGNOSIS — B356 Tinea cruris: Secondary | ICD-10-CM

## 2019-03-14 NOTE — Patient Instructions (Addendum)
Nice to meet you.  It appears like a fungal infection. Continue wound care as described in the hospital.   Please continue to take your FLUCONAZOLE and use the miconazole cream as prescribed.  You have a dermatology appointment with Dr. Nevada Crane on 03/20/19 at 3pm  Dr. Juel Burrow office is located:  [address] [place] [address]  [phone] (press 1)  We will also send a referral wound care for follow up as well pending this appointment.

## 2019-03-14 NOTE — Assessment & Plan Note (Signed)
Anthony Wiley has ulcerative lesions located on his lower abdomen, inner thighs, inguinal area, and scrotum of various stages. This has been treated as tinea infection to this point as well as a course of doxycyline. He has not seen much improvement since initial onset. Agree with continuation of fluconazole 200 mg daily in addition to miconazole cream for 14 days.  Instructed to use small amounts of cream as too much will increase moisture in the area. We arranged for follow up with dermatology for possible biopsy. I will also send a referral to wound care as these lesions may need more advanced wound care. Follow up with ID as needed or for signs of infection.

## 2019-03-14 NOTE — Telephone Encounter (Signed)
Per Terri Piedra, Np called Dr. Juel Burrow office to schedule an appointment for patient. Was able to schedule an appointment for 6/17 at 3 pm. Patient was notified about appointment and agreed to time and date. Black

## 2019-03-14 NOTE — Progress Notes (Signed)
Subjective:    Patient ID: Anthony Wiley, male    DOB: Aug 14, 1946, 73 y.o.   MRN: [id number]  Chief Complaint  Patient presents with  . Ulcerative lesions    HPI:  Anthony Wiley is a 73 y.o. 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   Anthony Wiley was initially seen by his primary care provider on 03/04/19 with a rash located in his groin that hat had been going on for over a month and refractory to previous treatments with hydrogen peroxide, Diflucan and corn starch with no relief. He was diagnosed with tinea cruris and cellulitis with treatment plan of fluconazole 100 mg daily for 14 days, nystatin powder and 10 days of doxycycline. On follow up 1 week later he was referred to the ED for further evaluation.  In the ED he had severe rash and large lesions located in his groin and lower abdomen. ID was consulted with the recommendation of increasing the fluconazole to 200 mg daily and for follow up in the ID office. He was counseled on wound care and advised to follow up in the ID office.  Since leaving the hospital he continues to have stinging or burning with no significant changes in the lesions. He is currently taking the fluconazole as prescribed with no adverse side effects. He is also using miconazole twice daily and performing wound care.    Allergies  Allergen Reactions  . Ace Inhibitors     UNSPECIFIED REACTION       Outpatient Medications Prior to Visit  Medication Sig Dispense Refill  . doxycycline (VIBRAMYCIN) 100 MG capsule Take 100 mg by mouth 2 (two) times daily.     . fluconazole (DIFLUCAN) 100 MG tablet Take 2 tablets (200 mg total) by mouth daily. 20 tablet 0  . fluticasone (FLONASE) 50 MCG/ACT nasal spray Place 1 spray into the nose daily as needed for allergies.     . hydrocortisone 2.5 % cream Apply 1 application topically 2 (two) times daily.     .  miconazole (MICOTIN) 2 % cream Apply 1 application topically 3 (three) times daily. 28.35 g 0  . nystatin (MYCOSTATIN/NYSTOP) powder Apply 1 Bottle topically 2 (two) times daily.     Marland Kitchen oxyCODONE-acetaminophen (PERCOCET/ROXICET) 5-325 MG tablet Take 1 tablet by mouth every 6 (six) hours as needed for moderate pain or severe pain.     Marland Kitchen aspirin EC 325 MG EC tablet Take 1 tablet (325 mg total) by mouth 2 (two) times daily after a meal. (Patient not taking: Reported on 03/11/2019) 30 tablet 0  . bisacodyl (DULCOLAX) 5 MG EC tablet Take 1 tablet (5 mg total) by mouth daily as needed for moderate constipation. (Patient not taking: Reported on 03/11/2019) 15 tablet 0  . docusate sodium (COLACE) 100 MG capsule Take 1 capsule (100 mg total) by mouth 2 (two) times daily. (Patient not taking: Reported on 03/11/2019) 30 capsule 0  . gabapentin (NEURONTIN) 300 MG capsule Take 300 mg by mouth 2 (two) times daily as needed (pain).     . meloxicam (MOBIC) 15 MG tablet Take 15 mg by mouth daily as needed for pain.     Marland Kitchen oxyCODONE-acetaminophen (PERCOCET) 7.5-325 MG tablet Take 1-2 tablets by mouth every 4 (four) hours as needed for severe pain. 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      Past Medical History:  Diagnosis Date  . Arthritis   . Hypertension       Past Surgical History:  Procedure Laterality Date  . BACK SURGERY    . JOINT REPLACEMENT    . KNEE ARTHROSCOPY Right   . NASAL SINUS SURGERY    . TOTAL KNEE ARTHROPLASTY Right 02/06/2018   Procedure: TOTAL KNEE ARTHROPLASTY;  Surgeon: Melrose Nakayama, MD;  Location: [facility];  Service: Orthopedics;  Laterality: Right;      No family history on file.    Social History   Socioeconomic History  . Marital status: Divorced    Spouse  name: Not on file  . Number of children: Not on file  . Years of education: Not on file  . Highest education level: Not on file  Occupational History  . Not on file  Social Needs  . Financial resource strain: Not on file  . Food insecurity    Worry: Not on file    Inability: Not on file  . Transportation needs    Medical: Not on file    Non-medical: Not on file  Tobacco Use  . Smoking status: Former Smoker    Packs/day: 4.00    Years: 45.00    Pack years: 180.00    Types: Cigarettes    Quit date: 10/03/1997    Years since quitting: 21.4  . Smokeless tobacco: Current User    Types: Chew  . Tobacco comment: 02/07/2018 "chew tobacco q now and then"  Substance and Sexual Activity  . Alcohol use: Yes    Alcohol/week: 13.0 standard drinks    Types: 13 Cans of beer per week  . Drug use: Never  . Sexual activity: Not on file  Lifestyle  . Physical activity    Days per week: Not on file    Minutes per session: Not on file  . Stress: Not on file  Relationships  . Social Herbalist on phone: Not on file    Gets together: Not on file    Attends religious service: Not on file    Active member of club or organization: Not on file    Attends meetings of clubs or organizations: Not on file    Relationship status: Not on file  . Intimate partner violence    Fear of current or ex partner: Not on file    Emotionally abused: Not on file    Physically abused: Not on file    Forced sexual activity: Not on file  Other Topics Concern  . Not on file  Social History Narrative  . Not on file      Review of Systems  Constitutional: Negative for chills and fever.  Respiratory: Negative for chest tightness, shortness of breath and wheezing.   Cardiovascular: Negative for chest pain.  Genitourinary: Negative for discharge, genital sores and penile pain.  Skin: Positive for rash.       Objective:    BP (!) 142/74   Pulse 95   Temp 97.9 F (36.6 C) (Oral)   Wt 223 lb  (101.2 kg)   BMI 35.99 kg/m  Nursing note and vital signs reviewed.  Physical Exam Constitutional:      General: He is not in acute distress.    Appearance: He is well-developed.  Cardiovascular:  Rate and Rhythm: Normal rate and regular rhythm.     Heart sounds: Normal heart sounds.  Pulmonary:     Effort: Pulmonary effort is normal.     Breath sounds: Normal breath sounds.  Skin:    General: Skin is warm and dry.     Comments: Multiple ulcerations of varying size located on the lower abdomen, groin, inguinal area, and scrotum. Distinct outer borders with ulcerated interiors.  Neurological:     Mental Status: He is alert.         Assessment & Plan:   Problem List Items Addressed This Visit      Musculoskeletal and Integument   Tinea cruris    Initial appearance is consistent with tinea infection, however difficult to assess given ulcerations at present and would also need to consider inflammatory condition as well. Referred to dermatology for further evaluation. Continue current dose of fluconazole. Could also consider change to itraconazole.         Other   Superficial ulcerative lesion [facility])    Mr. Batiz has ulcerative lesions located on his lower abdomen, inner thighs, inguinal area, and scrotum of various stages. This has been treated as tinea infection to this point as well as a course of doxycyline. He has not seen much improvement since initial onset. Agree with continuation of fluconazole 200 mg daily in addition to miconazole cream for 14 days.  Instructed to use small amounts of cream as too much will increase moisture in the area. We arranged for follow up with dermatology for possible biopsy. I will also send a referral to wound care as these lesions may need more advanced wound care. Follow up with ID as needed or for signs of infection.           I am having Anthony Wiley "Dickie" maintain his fluticasone, aspirin, bisacodyl, docusate sodium,  oxyCODONE-acetaminophen, gabapentin, meloxicam, hydrocortisone, nystatin, doxycycline, oxyCODONE-acetaminophen, fluconazole, and miconazole.   Follow-up: Pending referral to dermatology / wound care.    Terri Piedra, MSN, FNP-C Nurse Practitioner [facility] Office phone: [phone] Pager: [place] number: [phone]

## 2019-03-14 NOTE — Assessment & Plan Note (Signed)
Initial appearance is consistent with tinea infection, however difficult to assess given ulcerations at present and would also need to consider inflammatory condition as well. Referred to dermatology for further evaluation. Continue current dose of fluconazole. Could also consider change to itraconazole.

## 2019-03-16 LAB — CULTURE, BLOOD (ROUTINE X 2)
Culture: NO GROWTH
Culture: NO GROWTH

## 2019-04-11 ENCOUNTER — Encounter (HOSPITAL_BASED_OUTPATIENT_CLINIC_OR_DEPARTMENT_OTHER): Payer: Medicare Other | Attending: Internal Medicine

## 2019-04-11 DIAGNOSIS — L97112 Non-pressure chronic ulcer of right thigh with fat layer exposed: Secondary | ICD-10-CM | POA: Insufficient documentation

## 2019-04-11 DIAGNOSIS — S31105A Unspecified open wound of abdominal wall, periumbilic region without penetration into peritoneal cavity, initial encounter: Secondary | ICD-10-CM | POA: Insufficient documentation

## 2019-04-11 DIAGNOSIS — B356 Tinea cruris: Secondary | ICD-10-CM | POA: Diagnosis not present

## 2019-04-11 DIAGNOSIS — E669 Obesity, unspecified: Secondary | ICD-10-CM | POA: Insufficient documentation

## 2019-04-11 DIAGNOSIS — Z6832 Body mass index (BMI) 32.0-32.9, adult: Secondary | ICD-10-CM | POA: Diagnosis not present

## 2019-04-11 DIAGNOSIS — X58XXXA Exposure to other specified factors, initial encounter: Secondary | ICD-10-CM | POA: Insufficient documentation

## 2019-04-11 DIAGNOSIS — Z96651 Presence of right artificial knee joint: Secondary | ICD-10-CM | POA: Diagnosis not present

## 2019-04-11 DIAGNOSIS — Z87891 Personal history of nicotine dependence: Secondary | ICD-10-CM | POA: Diagnosis not present

## 2019-04-11 DIAGNOSIS — L03315 Cellulitis of perineum: Secondary | ICD-10-CM | POA: Diagnosis not present

## 2019-04-11 DIAGNOSIS — L97122 Non-pressure chronic ulcer of left thigh with fat layer exposed: Secondary | ICD-10-CM | POA: Diagnosis present

## 2019-04-11 DIAGNOSIS — I10 Essential (primary) hypertension: Secondary | ICD-10-CM | POA: Insufficient documentation

## 2019-04-19 DIAGNOSIS — L97122 Non-pressure chronic ulcer of left thigh with fat layer exposed: Secondary | ICD-10-CM | POA: Diagnosis not present

## 2019-05-03 DIAGNOSIS — L97122 Non-pressure chronic ulcer of left thigh with fat layer exposed: Secondary | ICD-10-CM | POA: Diagnosis not present

## 2019-05-17 ENCOUNTER — Encounter (HOSPITAL_BASED_OUTPATIENT_CLINIC_OR_DEPARTMENT_OTHER): Payer: Medicare Other | Attending: Internal Medicine

## 2019-05-17 DIAGNOSIS — L03315 Cellulitis of perineum: Secondary | ICD-10-CM | POA: Insufficient documentation

## 2019-05-17 DIAGNOSIS — I10 Essential (primary) hypertension: Secondary | ICD-10-CM | POA: Diagnosis not present

## 2019-05-17 DIAGNOSIS — S31105A Unspecified open wound of abdominal wall, periumbilic region without penetration into peritoneal cavity, initial encounter: Secondary | ICD-10-CM | POA: Insufficient documentation

## 2019-05-17 DIAGNOSIS — X58XXXA Exposure to other specified factors, initial encounter: Secondary | ICD-10-CM | POA: Insufficient documentation

## 2019-05-17 DIAGNOSIS — L97112 Non-pressure chronic ulcer of right thigh with fat layer exposed: Secondary | ICD-10-CM | POA: Diagnosis present

## 2019-08-22 IMAGING — MR MR LUMBAR SPINE W/O CM
4 of 5 series · 27 of 48 positions shown · non-contrast
Comparison: Lumbar spine CT myelogram 11/03/2011

CLINICAL DATA: Low back pain radiating to right lower extremity

EXAM:
MRI LUMBAR SPINE WITHOUT CONTRAST
TECHNIQUE: Multiplanar, multisequence MR imaging of the lumbar spine was
performed. No intravenous contrast was administered.

[Series 3: T2 post-contrast · sagittal · 4.0mm · 0.55mm/px · 6 of 13 slices shown]
[im 1/13]
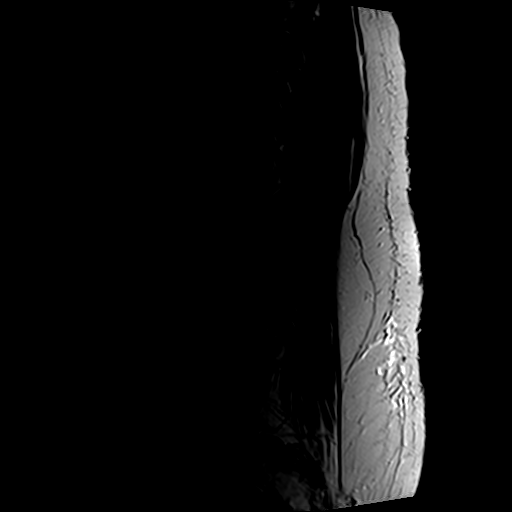
[im 3/13]
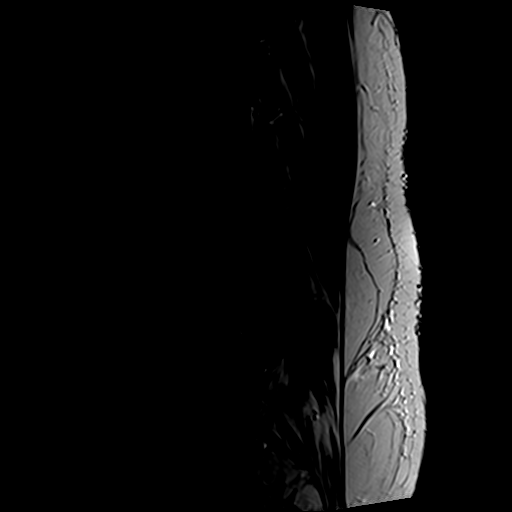
[im 5/13]
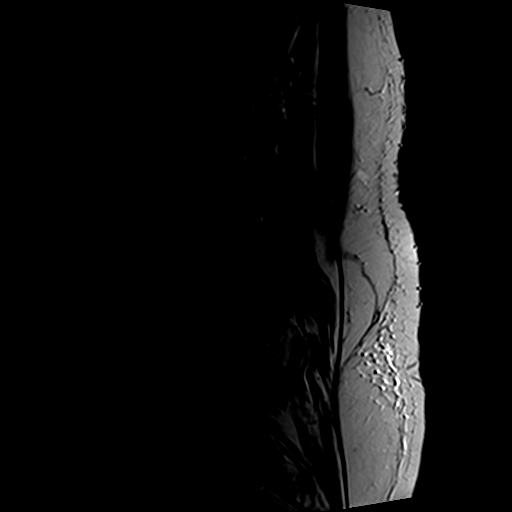
[im 8/13]
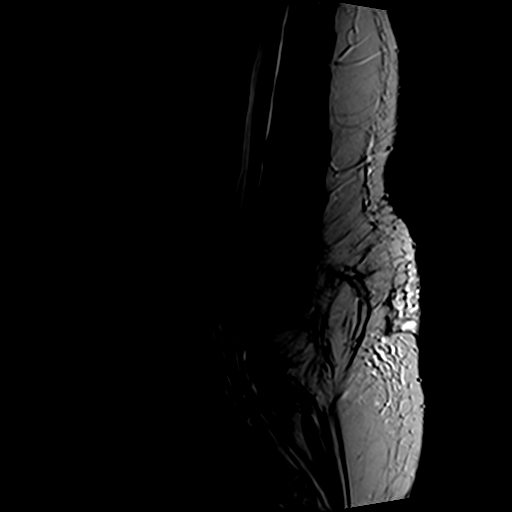
[im 10/13]
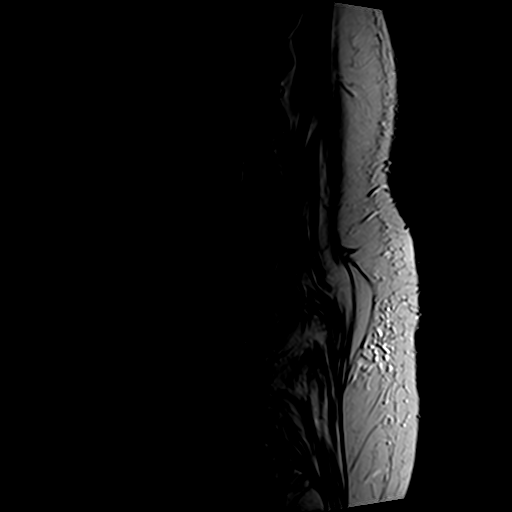
[im 13/13]
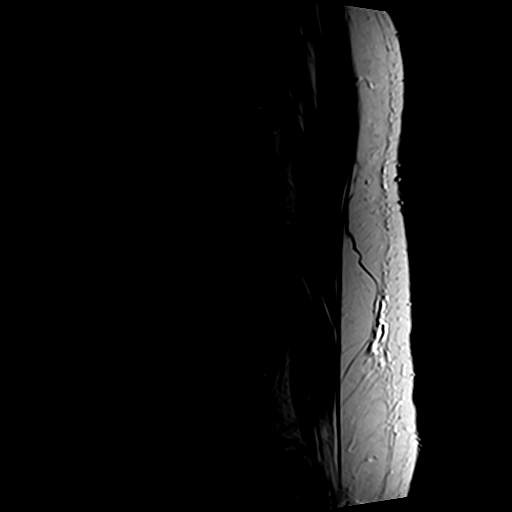

[Series 5: T1 · sagittal · 4.0mm · 0.55mm/px · 5 of 13 slices shown (1 of 2)]
[im 1/13]
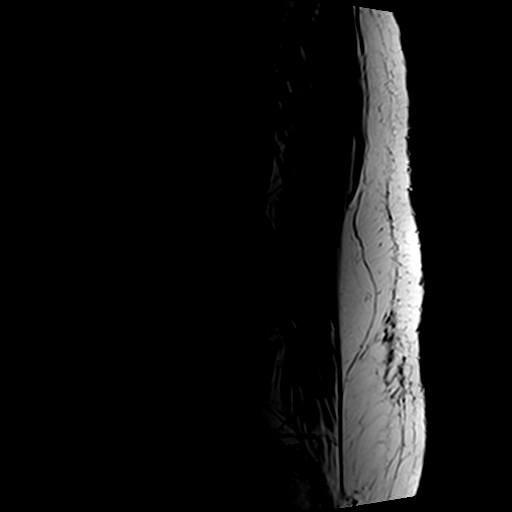
[im 4/13]
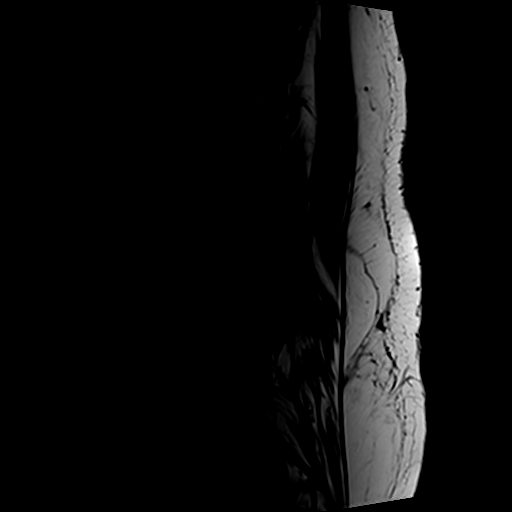
[im 7/13]
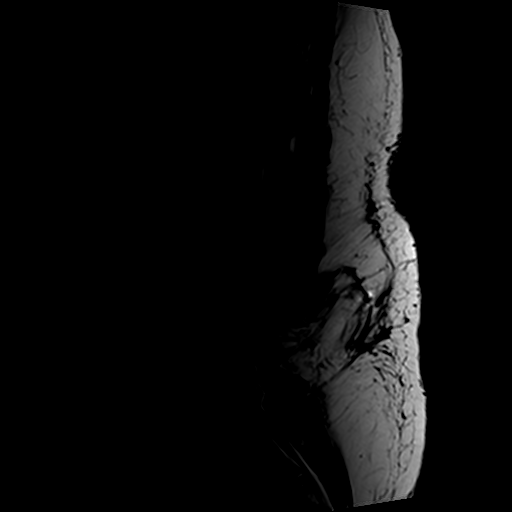
[im 10/13]
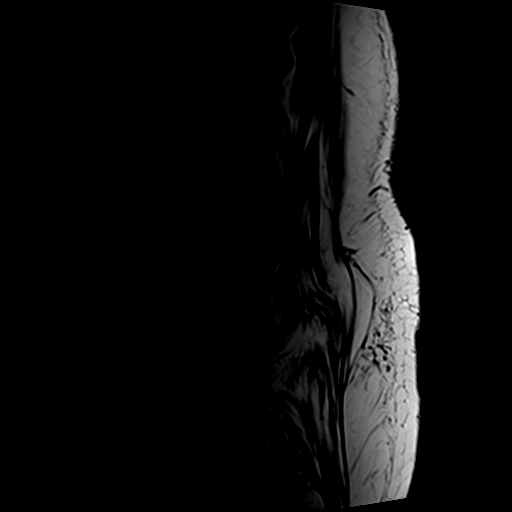
[im 13/13]
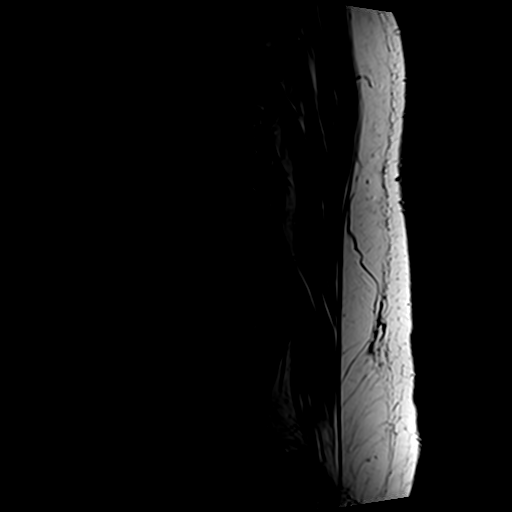

[Series 6: T1 · axial · 4.0mm · 0.35mm/px · z∈[-53,+117]mm · 6 of 38 slices shown (2 of 2)]
[im 3/38]
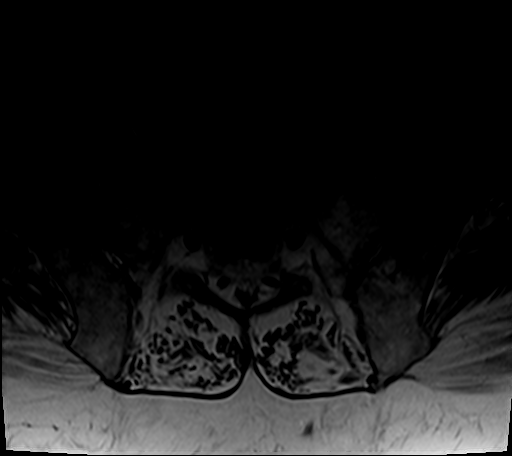
[im 5/38]
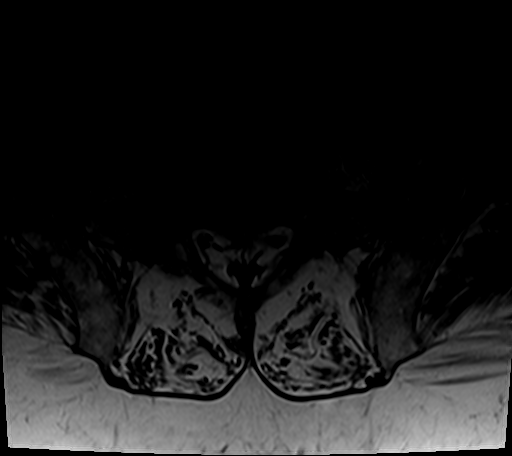
[im 8/38]
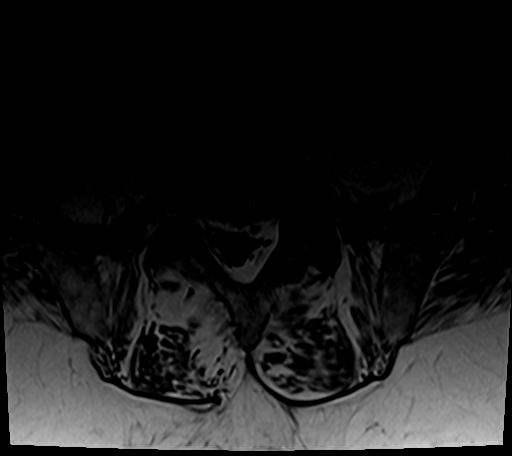
[im 13/38]
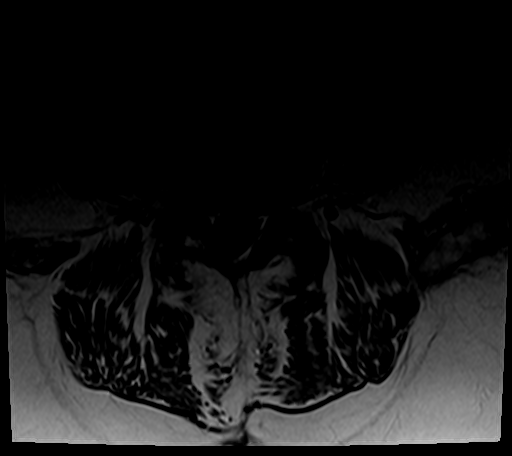
[im 20/38]
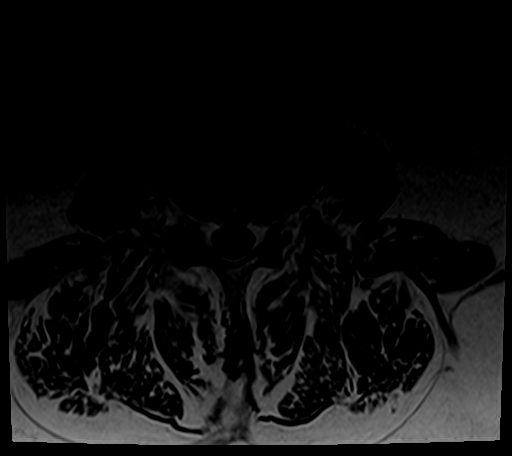
[im 33/38]
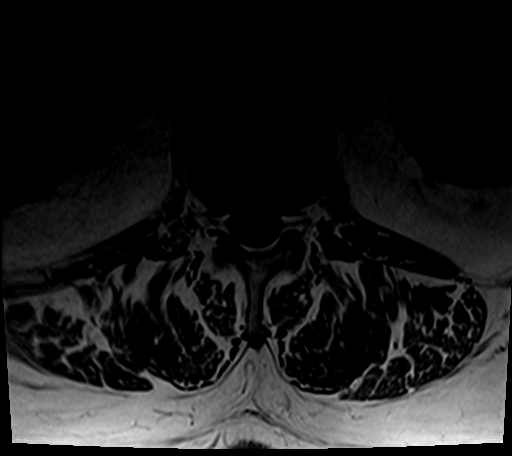

[Series 7: T2 · axial · 4.0mm · 0.70mm/px · z∈[-53,+149]mm · 10 of 38 slices shown]
[im 3/38]
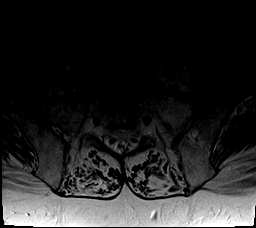
[im 5/38]
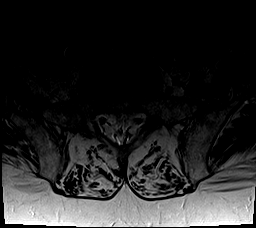
[im 8/38]
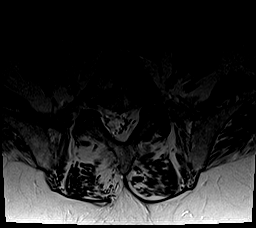
[im 13/38]
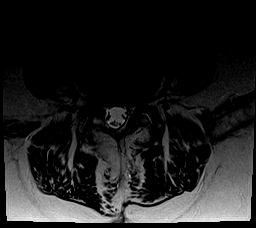
[im 18/38]
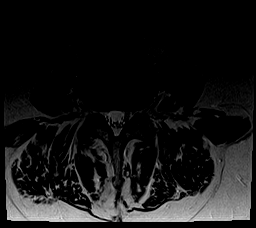
[im 20/38]
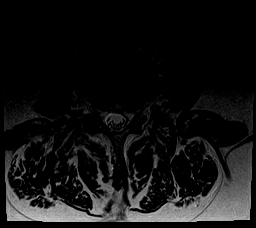
[im 23/38]
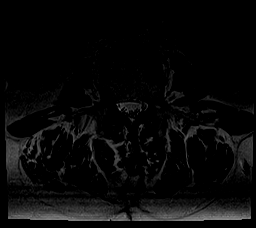
[im 28/38]
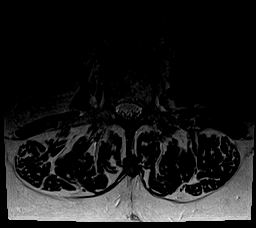
[im 33/38]
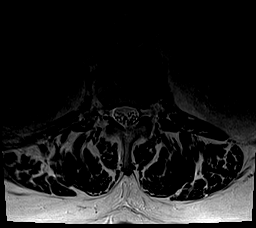
[im 38/38]
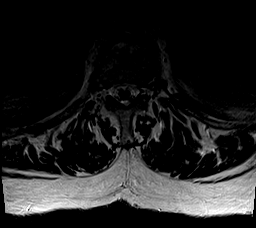

[27 of 48 positions shown; findings below may reference images not displayed]

FINDINGS: Segmentation: Normal. The lowest disc space is considered to be
L5-S1.

Alignment:  Normal

Vertebrae:  Mild heterogeneity of marrow signal.  Hemangioma at T12.

Conus medullaris and cauda equina: The conus medullaris terminates
at the L1 level. The cauda equina and conus medullaris are both
normal.

Paraspinal and other soft tissues: The visualized aorta, IVC and
iliac vessels are normal. The visualized retroperitoneal organs and
paraspinal soft tissues are normal.

Disc levels: Sagittal plane imaging includes the T11-12 disc level
through the upper sacrum, with axial imaging of the T12-L1 to L5-S1
disc levels.

The disc levels from T11-12 to L2-L3 are normal.

L3-4: Mild facet hypertrophy and small disc bulge without spinal
canal stenosis. No neural foraminal stenosis.

L4-5: Right posterior postsurgical changes. Small disc bulge. No
spinal canal stenosis. Moderate facet hypertrophy with mild
bilateral neural foraminal stenosis.

L5-S1: Disc space narrowing with mild bulge and endplate spurring.
Moderate bilateral neural foraminal stenosis.

The visualized portion of the sacrum is normal.
IMPRESSION: 1. Moderate bilateral L5-S1 neural foraminal stenosis.
2. Moderate L4-5 facet arthrosis with mild bilateral neural
foraminal stenosis.

## 2019-10-25 ENCOUNTER — Ambulatory Visit: Payer: Medicare Other | Attending: Internal Medicine

## 2019-10-25 DIAGNOSIS — Z23 Encounter for immunization: Secondary | ICD-10-CM | POA: Insufficient documentation

## 2019-10-25 NOTE — Progress Notes (Signed)
   Covid-19 Vaccination Clinic  Name:  Anthony Wiley    MRN: [id number] DOB: Jan 01, 1946  10/25/2019  Mr. Anthony Wiley was observed post Covid-19 immunization for 15 minutes without incidence. He was provided with Vaccine Information Sheet and instruction to access the V-Safe system.   Mr. Anthony Wiley was instructed to call 911 with any severe reactions post vaccine: Marland Kitchen Difficulty breathing  . Swelling of your face and throat  . A fast heartbeat  . A bad rash all over your body  . Dizziness and weakness    Immunizations Administered    Name Date Dose VIS Date Route   Pfizer COVID-19 Vaccine 10/25/2019 11:36 AM 0.3 mL 09/13/2019 Intramuscular   Manufacturer: [employer], [employer]   Lot: [id number]   [place]: [id number]

## 2019-11-15 ENCOUNTER — Ambulatory Visit: Payer: Medicare Other | Attending: Internal Medicine

## 2019-11-15 DIAGNOSIS — Z23 Encounter for immunization: Secondary | ICD-10-CM

## 2019-11-15 NOTE — Progress Notes (Signed)
   Covid-19 Vaccination Clinic  Name:  Anthony Wiley    MRN: [id number] DOB: [birth year]  11/15/2019  Anthony Wiley was observed post Covid-19 immunization for 15 minutes without incidence. He was provided with Vaccine Information Sheet and instruction to access the V-Safe system.   Anthony Wiley was instructed to call 911 with any severe reactions post vaccine: Marland Kitchen Difficulty breathing  . Swelling of your face and throat  . A fast heartbeat  . A bad rash all over your body  . Dizziness and weakness    Immunizations Administered    Name Date Dose VIS Date Route   Pfizer COVID-19 Vaccine 11/15/2019  4:35 PM 0.3 mL 09/13/2019 Intramuscular   Manufacturer: [place]   Lot: [id number]   [place]: [id number]

## 2020-03-25 ENCOUNTER — Other Ambulatory Visit: Payer: Self-pay | Admitting: Orthopaedic Surgery

## 2020-03-25 DIAGNOSIS — G8929 Other chronic pain: Secondary | ICD-10-CM

## 2021-03-21 ENCOUNTER — Emergency Department (HOSPITAL_COMMUNITY)
Admission: EM | Admit: 2021-03-21 | Discharge: 2021-04-02 | Disposition: E | Payer: Medicare Other | Attending: Emergency Medicine | Admitting: Emergency Medicine

## 2021-03-21 ENCOUNTER — Encounter (HOSPITAL_COMMUNITY): Payer: Self-pay

## 2021-03-21 DIAGNOSIS — Z87891 Personal history of nicotine dependence: Secondary | ICD-10-CM | POA: Diagnosis not present

## 2021-03-21 DIAGNOSIS — I10 Essential (primary) hypertension: Secondary | ICD-10-CM | POA: Insufficient documentation

## 2021-03-21 DIAGNOSIS — R092 Respiratory arrest: Secondary | ICD-10-CM | POA: Diagnosis not present

## 2021-03-21 DIAGNOSIS — R0602 Shortness of breath: Secondary | ICD-10-CM | POA: Diagnosis present

## 2021-03-21 DIAGNOSIS — Z96651 Presence of right artificial knee joint: Secondary | ICD-10-CM | POA: Diagnosis not present

## 2021-03-21 DIAGNOSIS — R7309 Other abnormal glucose: Secondary | ICD-10-CM | POA: Diagnosis not present

## 2021-03-21 LAB — I-STAT CHEM 8, ED
BUN: 10 mg/dL (ref 8–23)
Calcium, Ion: 1.05 mmol/L — ABNORMAL LOW (ref 1.15–1.40)
Chloride: 106 mmol/L (ref 98–111)
Creatinine, Ser: 1.2 mg/dL (ref 0.61–1.24)
Glucose, Bld: 343 mg/dL — ABNORMAL HIGH (ref 70–99)
HCT: 44 % (ref 39.0–52.0)
Hemoglobin: 15 g/dL (ref 13.0–17.0)
Potassium: 3.8 mmol/L (ref 3.5–5.1)
Sodium: 138 mmol/L (ref 135–145)
TCO2: 19 mmol/L — ABNORMAL LOW (ref 22–32)

## 2021-03-21 LAB — CBG MONITORING, ED: Glucose-Capillary: 233 mg/dL — ABNORMAL HIGH (ref 70–99)

## 2021-03-21 MED ORDER — NOREPINEPHRINE 4 MG/250ML-% IV SOLN
INTRAVENOUS | Status: AC | PRN
  Administered 2021-03-21: 4 ug/min via INTRAVENOUS

## 2021-03-21 MED ORDER — NOREPINEPHRINE BITARTRATE 1 MG/ML IV SOLN
INTRAVENOUS | Status: AC | PRN
  Administered 2021-03-21: 40 mg via INTRAVENOUS

## 2021-03-21 MED ORDER — SODIUM CHLORIDE 0.9 % IV SOLN
INTRAVENOUS | Status: AC | PRN
  Administered 2021-03-21: 1000 mL via INTRAVENOUS

## 2021-03-21 MED ORDER — AMIODARONE HCL 150 MG/3ML IV SOLN
INTRAVENOUS | Status: AC | PRN
  Administered 2021-03-21: 300 mg via INTRAVENOUS

## 2021-03-21 MED ORDER — EPINEPHRINE 1 MG/10ML IJ SOSY
PREFILLED_SYRINGE | INTRAMUSCULAR | Status: AC | PRN
  Administered 2021-03-21: 0.1 mg via INTRAVENOUS

## 2021-04-02 NOTE — ED Provider Notes (Signed)
[place] EMERGENCY DEPARTMENT Provider Note   CSN: [id number] Arrival date & time:        History Chief Complaint  Patient presents with   Cardiac Arrest    Anthony Wiley is a 75 y.o. male.  HPI 75 year old male presents in cardiac arrest.  History is from EMS only.  The patient himself called EMS for shortness of breath but when they got there they found him cyanotic and on the ground.  King airway was placed.  CPR was started and has been ongoing for over an hour now.  They have gotten ROSC many times and has had at least 8 epinephrines.  They will get return of spontaneous circulation for a few minutes and then lose it.  No further history is known.  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  Social History   Tobacco Use   Smoking status: Former    Packs/day: 4.00    Years: 45.00    Pack years: 180.00    Types: Cigarettes    Quit date: 10/03/1997    Years since quitting: 23.4   Smokeless tobacco: Current    Types: Chew   Tobacco comments:    02/07/2018 "chew tobacco q now and then"  Vaping Use   Vaping Use: Never used  Substance Use Topics   Alcohol use: Yes    Alcohol/week: 13.0 standard drinks    Types: 13 Cans of beer per week   Drug use: Never    Home Medications Prior to Admission medications   Medication Sig Start Date End Date Taking? Authorizing Provider  aspirin EC 325 MG  EC tablet Take 1 tablet (325 mg total) by mouth 2 (two) times daily after a meal. Patient not taking: Reported on 03/11/2019 02/09/18   Loni Dolly, PA-C  bisacodyl (DULCOLAX) 5 MG EC tablet Take 1 tablet (5 mg total) by mouth daily as needed for moderate constipation. Patient not taking: Reported on 03/11/2019 02/09/18   Loni Dolly, PA-C  docusate sodium (COLACE) 100 MG capsule Take 1 capsule (100 mg total) by mouth 2 (two) times daily. Patient not taking: Reported on 03/11/2019 02/09/18   Loni Dolly, PA-C  doxycycline (VIBRAMYCIN) 100 MG capsule Take 100 mg by mouth 2 (two) times daily.  03/04/19   [provider]  fluconazole (DIFLUCAN) 100 MG tablet Take 2 tablets (200 mg total) by mouth daily. 03/11/19   Charlesetta Shanks, MD  fluticasone (FLONASE) 50 MCG/ACT nasal spray Place 1 spray into the nose daily as needed for allergies.     [provider]  gabapentin (NEURONTIN) 300 MG capsule Take 300 mg by mouth 2 (two) times daily as needed (pain).  01/01/19   [provider]  hydrocortisone 2.5 % cream Apply 1 application topically 2 (two) times daily.  02/18/19   [provider]  meloxicam (MOBIC) 15 MG tablet Take 15 mg by mouth daily as needed for pain.  01/23/19   [provider]  miconazole (MICOTIN) 2 % cream Apply 1 application topically 3 (three) times daily. 03/11/19   Charlesetta Shanks, MD  nystatin (MYCOSTATIN/NYSTOP) powder Apply 1 Bottle topically 2 (two) times daily.  03/04/19   [provider]  oxyCODONE-acetaminophen (PERCOCET) 7.5-325 MG tablet Take 1-2 tablets by mouth every 4 (four) hours as needed for severe pain. Patient had right total knee 02/07/2018   Patient has been on this medication in the hospital for 3 days with good pain control and no adverse events Patient not taking: Reported on 03/11/2019 02/10/18   Shepperson, Kirstin, PA-C  oxyCODONE-acetaminophen (PERCOCET/ROXICET) 5-325 MG tablet Take 1 tablet by mouth every 6 (six) hours as  needed for moderate pain or severe pain.  02/26/19   [provider]    Allergies    Ace inhibitors  Review of Systems   Review of Systems  Unable to perform ROS: Patient unresponsive   Physical Exam Updated Vital Signs There were no vitals taken for this visit.  Physical Exam Vitals and nursing note reviewed.  Constitutional:      Appearance: He is well-developed.  HENT:     Head: Normocephalic and atraumatic.     Right Ear: External ear normal.     Left Ear: External ear normal.     Nose: Nose normal.  Eyes:     General:        Right eye: No discharge.        Left eye: No discharge.     Comments: Pupils fixed and nonreactive  Cardiovascular:     Comments: No carotid or femoral pulses Abdominal:     General: There is no distension.  Musculoskeletal:     Cervical back: Neck supple.  Skin:    General: Skin is warm and dry.  Neurological:     Mental Status: He is unresponsive.     GCS: GCS eye subscore is 1. GCS verbal subscore is 1. GCS motor subscore is 1.  Psychiatric:        Mood and Affect: Mood is not anxious.    ED Results / Procedures / Treatments   Labs (all labs ordered are listed, but only abnormal results are displayed) Labs Reviewed  CBG MONITORING, ED - Abnormal; Notable for the following components:      Result Value   Glucose-Capillary 233 (*)    All other components within normal limits    EKG None  Radiology No results found.  Procedures Procedure Name: Intubation Date/Time: 03/30/21 6:02 PM Performed by: Sherwood Gambler, MD Pre-anesthesia Checklist: Patient identified, Patient being monitored, Emergency Drugs available and Suction available Oxygen Delivery Method: Ambu bag Laryngoscope Size: Glidescope and 4 Grade View: Grade I Tube size: 7.5 mm Number of attempts: 1 Airway Equipment and Method: Video-laryngoscopy Placement Confirmation: ETT inserted through vocal cords under direct vision, CO2 detector and Breath sounds  checked- equal and bilateral Secured at: 25 cm Dental Injury: Teeth and Oropharynx as per pre-operative assessment       Cardiopulmonary Resuscitation (CPR) Procedure Note Directed/Performed by: Ephraim Hamburger I personally directed ancillary staff and/or performed CPR in an effort to regain return of spontaneous circulation and to maintain cardiac, neuro and systemic perfusion.   Medications Ordered in ED Medications - No data to display  ED Course  I have reviewed the triage vital signs and the nursing notes.  Pertinent labs &  imaging results that were available during my care of the patient were reviewed by me and considered in my medical decision making (see chart for details).    MDM Rules/Calculators/A&P                          Based on presentation and this seeming to be a respiratory arrest he was intubated quickly after EMS arrival.  He was very easily bagged with the [facility] airway but then it became difficult to bag with the ET tube, I do not know how well he was previously being ventilated.  While he did have good color change and his rhythm seem to change to V. fib (which was defibrillated), we unfortunately were not able to get return of spontaneous circulation.  After couple rounds of CPR and epinephrine I had a bedside ultrasound on the patient and there were no pulses and minimal to no activity that turned into no activity after just a couple seconds.  He is now asystole.  Given the prolonged downtime I think it is highly unlikely that we will either get return of spontaneous circulation more permanently or that he will have any type of significant neuro outcome.  Time of death 68.  Discussed with medical examiner Lanny Hurst and while its unclear why he was short of breath given no significant history, he would not be a medical examiner candidate.  I discussed in person with his sister and let her know of his unfortunate demise. Final Clinical Impression(s) / ED Diagnoses Final  diagnoses:  Respiratory arrest [facility])    Rx / DC Orders ED Discharge Orders     None        Sherwood Gambler, MD 04-13-2021 [id number]

## 2021-04-02 NOTE — ED Triage Notes (Signed)
Pt BIB [facility] from home, pt called out himself for RD, on arrival pt not answering door, they had to knock the door down, pt pulseless.  CPR started at 1630 Epi x9 given Epi gtt started [facility] Airway placed  Assisting w/BVM I/O Left Tib/Fib  18g LAC

## 2021-04-02 DEATH — deceased
